# Patient Record
Sex: Male | Born: 1975
Health system: Southern US, Community
[De-identification: ages and names within clinical notes are randomized; demographics above are authoritative.]

## PROBLEM LIST (undated history)

## (undated) DIAGNOSIS — F419 Anxiety disorder, unspecified: Secondary | ICD-10-CM

## (undated) DIAGNOSIS — J449 Chronic obstructive pulmonary disease, unspecified: Secondary | ICD-10-CM

## (undated) DIAGNOSIS — M1990 Unspecified osteoarthritis, unspecified site: Secondary | ICD-10-CM

## (undated) HISTORY — DX: Chronic obstructive pulmonary disease, unspecified: J44.9

## (undated) HISTORY — DX: Unspecified osteoarthritis, unspecified site: M19.90

## (undated) HISTORY — DX: Anxiety disorder, unspecified: F41.9

---

## 2005-01-21 ENCOUNTER — Emergency Department: Payer: Self-pay | Admitting: Emergency Medicine

## 2005-10-18 ENCOUNTER — Emergency Department: Payer: Self-pay | Admitting: Emergency Medicine

## 2009-03-22 ENCOUNTER — Emergency Department: Payer: Self-pay | Admitting: Emergency Medicine

## 2010-01-08 ENCOUNTER — Ambulatory Visit: Payer: Self-pay | Admitting: Family Medicine

## 2019-02-10 ENCOUNTER — Emergency Department: Payer: 59

## 2019-02-10 ENCOUNTER — Emergency Department
Admission: EM | Admit: 2019-02-10 | Discharge: 2019-02-10 | Disposition: A | Discharge status: Home / Self Care | Payer: 59 | Attending: Emergency Medicine | Admitting: Emergency Medicine

## 2019-02-10 ENCOUNTER — Encounter: Payer: Self-pay | Admitting: Emergency Medicine

## 2019-02-10 ENCOUNTER — Telehealth: Payer: Self-pay | Admitting: Internal Medicine

## 2019-02-10 ENCOUNTER — Other Ambulatory Visit: Payer: Self-pay

## 2019-02-10 DIAGNOSIS — F17210 Nicotine dependence, cigarettes, uncomplicated: Secondary | ICD-10-CM | POA: Insufficient documentation

## 2019-02-10 DIAGNOSIS — R079 Chest pain, unspecified: Secondary | ICD-10-CM | POA: Diagnosis not present

## 2019-02-10 LAB — CBC
HCT: 49 % (ref 39.0–52.0)
Hemoglobin: 16.8 g/dL (ref 13.0–17.0)
MCH: 31.2 pg (ref 26.0–34.0)
MCHC: 34.3 g/dL (ref 30.0–36.0)
MCV: 91.1 fL (ref 80.0–100.0)
Platelets: 174 10*3/uL (ref 150–400)
RBC: 5.38 MIL/uL (ref 4.22–5.81)
RDW: 11.9 % (ref 11.5–15.5)
WBC: 8.9 10*3/uL (ref 4.0–10.5)
nRBC: 0 % (ref 0.0–0.2)

## 2019-02-10 LAB — BASIC METABOLIC PANEL
Anion gap: 6 (ref 5–15)
BUN: 8 mg/dL (ref 6–20)
CO2: 27 mmol/L (ref 22–32)
Calcium: 8.9 mg/dL (ref 8.9–10.3)
Chloride: 108 mmol/L (ref 98–111)
Creatinine, Ser: 0.61 mg/dL (ref 0.61–1.24)
GFR calc Af Amer: >60 mL/min (ref >60)
GFR calc non Af Amer: >60 mL/min (ref >60)
Glucose, Bld: 87 mg/dL (ref 70–99)
Potassium: 3.9 mmol/L (ref 3.5–5.1)
Sodium: 141 mmol/L (ref 135–145)

## 2019-02-10 LAB — TROPONIN I (HIGH SENSITIVITY)
Troponin I (High Sensitivity): <2 ng/L (ref <18)
Troponin I (High Sensitivity): <2 ng/L (ref <18)

## 2019-02-10 NOTE — ED Provider Notes (Signed)
[facility] Emergency Department Provider Note  Time seen: 9:47 AM  I have reviewed the triage vital signs and the nursing notes.   HISTORY  Chief Complaint Chest Pain   HPI Lucas Peterson is a 43 y.o. male with no significant past medical history presents to the emergency department for intermittent chest pain.  According to the patient since March she has been experiencing intermittent chest pain which he describes as a throbbing type pain in the center to left chest.  States it is intermittent will come and go, not necessarily associated with exertion.  Patient states last night the pain was somewhat worse, states his father recently passed away from an MI, patient was concerned so he came to the emergency department today for evaluation.  States very mild discomfort currently which he states has been chronic since March.  No worsening with movement or palpation.  No shortness of breath.  Does state mild cough but states this is chronic due to smoking.  Denies any fever.   History reviewed. No pertinent past medical history.  There are no active problems to display for this patient.   History reviewed. No pertinent surgical history.  Prior to Admission medications   Not on File    No Known Allergies  No family history on file.  Social History Social History   Tobacco Use  . Smoking status: Current Every Day Smoker    Packs/day: 1.50    Types: Cigarettes  . Smokeless tobacco: Never Used  Substance Use Topics  . Alcohol use: Never    Frequency: Never  . Drug use: Never    Review of Systems Constitutional: Negative for fever. Cardiovascular: Positive for chest pain Respiratory: Negative for shortness of breath.  Mild cough, chronic. Gastrointestinal: Negative for abdominal pain Musculoskeletal: Negative for leg pain or swelling. Skin: Negative for skin complaints  Neurological: Negative for headache All other ROS  negative  ____________________________________________   PHYSICAL EXAM:  VITAL SIGNS: ED Triage Vitals [02/10/19 0932]  Enc Vitals Group     BP (!) 156/101     Pulse Rate 91     Resp 16     Temp 98.1 F (36.7 C)     Temp Source Oral     SpO2 98 %     Weight 179 lb (81.2 kg)     Height 5\' 10"  (1.778 m)     Head Circumference      Peak Flow      Pain Score 7     Pain Loc      Pain Edu?      Excl. in GC?    Constitutional: Alert and oriented. Well appearing and in no distress. Eyes: Normal exam ENT      Head: Normocephalic and atraumatic.      Mouth/Throat: Mucous membranes are moist. Cardiovascular: Normal rate, regular rhythm. Respiratory: Normal respiratory effort without tachypnea nor retractions.  Light expiratory wheeze. Gastrointestinal: Soft and nontender. No distention.   Musculoskeletal: Nontender with normal range of motion in all extremities. Neurologic:  Normal speech and language. No gross focal neurologic deficits Skin:  Skin is warm, dry and intact.  Psychiatric: Mood and affect are normal.   ____________________________________________    EKG  EKG viewed and interpreted by myself shows a normal sinus rhythm 85 bpm with a narrow QRS, normal axis, normal intervals, no concerning ST changes.  ____________________________________________    RADIOLOGY  Chest x-ray is negative  ____________________________________________   INITIAL IMPRESSION / ASSESSMENT  AND PLAN / ED COURSE  Pertinent labs & imaging results that were available during my care of the patient were reviewed by me and considered in my medical decision making (see chart for details).   Patient presents to the emergency department for chest pain intermittent over the past 3 to 4 months, worse last night.  Differential would include ACS, chest wall pain, pleurisy.  We will check labs including cardiac enzymes.  EKG is reassuring.  Chest x-ray pending.  Patient agreeable to plan of  care.  Patient's work-up is largely negative/reassuring.  Chest x-ray is normal.  Troponin is negative.  We will have the patient follow-up with cardiology as an outpatient.  Patient agreeable plan of care.  Lucas Peterson was evaluated in Emergency Department on 02/10/2019 for the symptoms described in the history of present illness. He was evaluated in the context of the global COVID-19 pandemic, which necessitated consideration that the patient might be at risk for infection with the SARS-CoV-2 virus that causes COVID-19. Institutional protocols and algorithms that pertain to the evaluation of patients at risk for COVID-19 are in a state of rapid change based on information released by regulatory bodies including the CDC and federal and state organizations. These policies and algorithms were followed during the patient's care in the ED.  ____________________________________________   FINAL CLINICAL IMPRESSION(S) / ED DIAGNOSES  Chest pain   Harvest Dark, MD 02/10/19 1217

## 2019-02-10 NOTE — ED Notes (Signed)
Patient discharged to home. IV hep lock d/c'd

## 2019-02-10 NOTE — ED Notes (Signed)
Troponin drawn and sent.

## 2019-02-10 NOTE — ED Triage Notes (Signed)
Patient reports intermittent CP since March. States in March, he had a severe cough that lasted several weeks. Reports since then he has "episodes" of CP. During episodes he reports diaphoresis, dizziness and SOB. Patient reports he recently lost father to a MI.

## 2019-02-10 NOTE — Telephone Encounter (Signed)
° ° °  COVID-19 Pre-Screening Questions:   In the past 7 to 10 days have you had a cough,  shortness of breath, headache, congestion, fever (100 or greater) body aches, chills, sore throat, or sudden loss of taste or sense of smell? No    Have you been around anyone with known Covid 19. no  Have you been around anyone who is awaiting Covid 19 test results in the past 7 to 10 days? no  Have you been around anyone who has been exposed to Covid 19, or has mentioned symptoms of Covid 19 within the past 7 to 10 days? no  If you have any concerns/questions about symptoms patients report during screening (either on the phone or at threshold). Contact the provider seeing the patient or DOD for further guidance.  If neither are available contact a member of the leadership team.  I called pt to confirm appt and pre-screen pt for his 02-13-19 appt with Dr Debara Pickett.

## 2019-02-13 ENCOUNTER — Ambulatory Visit: Payer: 59 | Admitting: Internal Medicine

## 2019-02-28 ENCOUNTER — Other Ambulatory Visit: Payer: Self-pay

## 2019-02-28 ENCOUNTER — Encounter: Payer: Self-pay | Admitting: Family Medicine

## 2019-02-28 ENCOUNTER — Ambulatory Visit (INDEPENDENT_AMBULATORY_CARE_PROVIDER_SITE_OTHER): Payer: Self-pay | Admitting: Family Medicine

## 2019-02-28 VITALS — BP 122/81 | HR 86 | Temp 98.7°F | Resp 16 | Ht 70.0 in | Wt 180.6 lb

## 2019-02-28 DIAGNOSIS — J432 Centrilobular emphysema: Secondary | ICD-10-CM

## 2019-02-28 DIAGNOSIS — R0609 Other forms of dyspnea: Secondary | ICD-10-CM

## 2019-02-28 DIAGNOSIS — Z720 Tobacco use: Secondary | ICD-10-CM

## 2019-02-28 DIAGNOSIS — F411 Generalized anxiety disorder: Secondary | ICD-10-CM

## 2019-02-28 DIAGNOSIS — R7309 Other abnormal glucose: Secondary | ICD-10-CM

## 2019-02-28 MED ORDER — HYDROXYZINE HCL 25 MG PO TABS: 12.5000 mg | ORAL_TABLET | Freq: Two times a day (BID) | ORAL | 1 refills | Status: DC | PRN

## 2019-02-28 MED ORDER — ALBUTEROL SULFATE HFA 108 (90 BASE) MCG/ACT IN AERS: 2.0000 | INHALATION_SPRAY | RESPIRATORY_TRACT | 1 refills | Status: DC | PRN

## 2019-02-28 MED ORDER — ANORO ELLIPTA 62.5-25 MCG/INH IN AEPB: 1.0000 | INHALATION_SPRAY | Freq: Every day | RESPIRATORY_TRACT | 0 refills | Status: DC

## 2019-02-28 NOTE — Progress Notes (Signed)
Subjective:    Patient ID: Lucas Peterson, male    DOB: 04/22/1976, 43 y.o.   MRN: [id number]  Lucas Peterson is a 43 y.o. male presenting on 02/28/2019 for Establish Care (as per patient may be having anxiety or panic attack since had some chest pain went to ED in 06/20 sweating, SOB, insomnia, stress)  [facility]. Referred from Emergency Dept visit on 02/10/19.  HPI   ED FOLLOW-UP VISIT  Hospital/Location: [facility] Date of ED Visit: 02/10/19  Reason for Presenting to ED: Chest Pain Primary (+Secondary) Diagnosis: Chest Pain unspecified  FOLLOW-UP  - ED provider note and record have been reviewed - Patient presents today about 18 days after recent ED visit. Brief summary of recent course, patient had symptoms of intermittent left sided chest wall pain for 3-4 months and dyspnea, presented to ED on 6/26, testing in ED with labs, troponin, chest x-ray, EKG, all unremarkable, treated with discharge reassurance and follow-up with outpatient Cardiology  - Today reports overall has done fairly well after discharge from ED without worsening but still has same symptoms in general.  He reviews past illness onset in December 2019 with frequent coughing and illness, he thought maybe he fractured rib at some point from coughing, then he had later illness in March 2020, was negative for Flu at that time. - He describes episodic pains in Left side of chest wall and affecting his left arm, worse if laying down on left side at night., over past 3-4 months - He says difficulty getting comfortable at night due to pain, he gets too tired eventually falls asleep - He works outside as a Soil scientistland surveyor, works in [employer] - Admits some dyspnea at times with some exertion, questioning if this is related to his smoking and lungs, or other issue, he thinks may also be anxiety related, see below Father recently passed from MI He is not aware of any known exposure to COVID19 but  he does have that report of illness in March 2020 unclear diagnosis  [facility] [place] 03/24/19 at 9am with Dr Jacques NavyAcharya as new patient evaluation to discuss his symptoms and recent work-up  Chronic Anxiety, generalized He admits concerns chronic history of some general anxiety at times. Says he has had anxiety issues for many years, not just recently during coronavirus pandemic or recently with current dyspnea and chest symptoms. He thinks he is an anxious person, never clinically diagnosed or treated before, he does not like medication.  - New medications on discharge: None - Changes to current meds on discharge: None  Additional updates:  History of Low Testosterone Previous PCP tested labs including testosterone was told it was low, given testosterone injections then unsure if this helped.  Pre-Diabetes Reports history of elevated A1c in past, no result available. Meds: None Denies hypoglycemia, polyuria, visual changes, numbness or tingling.   Health Maintenance: [facility]  Dep[facility] screen [facility] 02/28/2019  Decreased Interest 2  Down, Depressed, Hopeless 1  PHQ - 2 Score 3  Altered sleeping 3  Tired, decreased energy 2  Change in appetite 0  Feeling bad or failure about yourself  0  Trouble concentrating 2  Moving slowly or fidgety/restless 0  Suicidal thoughts 0  PHQ-9 Score 10  Difficult doing work/chores Somewhat difficult   GAD 7 : Generalized Anxiety Score 02/28/2019  Nervous, Anxious, on Edge 3  Control/stop worrying 2  Worry too much - different things 2  Trouble relaxing 3  Restless 2  Easily annoyed or irritable 3  Afraid - awful might happen 1  Total GAD 7 Score 16  Anxiety Difficulty Somewhat difficult    Past Medical History:  Diagnosis Date  . Anxiety    History reviewed. No pertinent surgical history. Social History   Socioeconomic History  . Marital status: Single    Spouse name: Not on file   . Number of children: Not on file  . Years of education: Not on file  . Highest education level: Not on file  Occupational History  . Not on file  Social Needs  . Financial resource strain: Not on file  . Food insecurity    Worry: Not on file    Inability: Not on file  . Transportation needs    Medical: Not on file    Non-medical: Not on file  Tobacco Use  . Smoking status: Current Every Day Smoker    Packs/day: 1.50    Years: 20.00    Pack years: 30.00    Types: Cigarettes  . Smokeless tobacco: Never Used  . Tobacco comment: previously 0.5 to 1ppd, now 10 years 1.5 year  Substance and Sexual Activity  . Alcohol use: Never    Frequency: Never  . Drug use: Never  . Sexual activity: Not on file  Lifestyle  . Physical activity    Days per week: Not on file    Minutes per session: Not on file  . Stress: Not on file  Relationships  . Social Herbalist on phone: Not on file    Gets together: Not on file    Attends religious service: Not on file    Active member of club or organization: Not on file    Attends meetings of clubs or organizations: Not on file    Relationship status: Not on file  . Intimate partner violence    Fear of current or ex partner: Not on file    Emotionally abused: Not on file    Physically abused: Not on file    Forced sexual activity: Not on file  Other Topics Concern  . Not on file  Social History Narrative  . Not on file   Family History  Problem Relation Age of Onset  . Stroke Mother 24  . Diabetes Mother   . Stroke Father 33  . Thyroid disease Sister   . Diabetes Brother    Current Outpatient Medications on File Prior to Visit  Medication Sig  . Aspirin-Salicylamide-Caffeine (BC FAST PAIN RELIEF) 650-195-33.3 MG PACK Take 1 packet by mouth as needed.    No current facility-administered medications on file prior to visit.     Review of Systems Per HPI unless specifically indicated above     Objective:    BP 122/81    Pulse 86   Temp 98.7 F (37.1 C) (Oral)   Resp 16   Ht 5\' 10"  (1.778 m)   Wt 180 lb 9.6 oz (81.9 kg)   SpO2 98%   BMI 25.91 kg/m   Wt Readings from Last 3 Encounters:  02/28/19 180 lb 9.6 oz (81.9 kg)  02/10/19 179 lb (81.2 kg)    Physical Exam Vitals signs and nursing note reviewed.  Constitutional:      General: He is not in acute distress.    Appearance: He is well-developed. He is not diaphoretic.     Comments: Well-appearing, comfortable, cooperative  HENT:     Head: Normocephalic and  atraumatic.  Eyes:     General:        Right eye: No discharge.        Left eye: No discharge.     Conjunctiva/sclera: Conjunctivae normal.  Neck:     Musculoskeletal: Normal range of motion and neck supple.     Thyroid: No thyromegaly.  Cardiovascular:     Rate and Rhythm: Normal rate and regular rhythm.     Heart sounds: Normal heart sounds. No murmur.  Pulmonary:     Effort: Pulmonary effort is normal. No respiratory distress.     Breath sounds: Normal breath sounds. No wheezing or rales.  Musculoskeletal: Normal range of motion.  Lymphadenopathy:     Cervical: No cervical adenopathy.  Skin:    General: Skin is warm and dry.     Findings: No erythema or rash.  Neurological:     Mental Status: He is alert and oriented to person, place, and time.  Psychiatric:        Behavior: Behavior normal.     Comments: Well groomed, good eye contact, normal speech and thoughts      I have personally reviewed the radiology report from 02/10/19 CXR.  DG Chest Port 1 ViewPerformed 02/10/2019 Final result  Study Result CLINICAL DATA: Chest pain  EXAM: PORTABLE CHEST 1 VIEW  COMPARISON: October 18, 2005  FINDINGS: There is no evident edema or consolidation. Heart size and pulmonary vascularity are normal. No adenopathy. No pneumothorax. No bone lesions.  IMPRESSION: No edema or consolidation.   Electronically Signed By: Bretta BangWilliam Woodruff III M.D. On: 02/10/2019 10:05   Results  for orders placed or performed during the hospital encounter of 02/10/19  Basic metabolic panel  Result Value Ref Range   Sodium 141 135 - 145 mmol/L   Potassium 3.9 3.5 - 5.1 mmol/L   Chloride 108 98 - 111 mmol/L   CO2 27 22 - 32 mmol/L   Glucose, Bld 87 70 - 99 mg/dL   BUN 8 6 - 20 mg/dL   Creatinine, Ser 1.610.61 0.61 - 1.24 mg/dL   Calcium 8.9 8.9 - 09.610.3 mg/dL   GFR calc non Af Amer >60 >60 mL/min   GFR calc Af Amer >60 >60 mL/min   Anion gap 6 5 - 15  CBC  Result Value Ref Range   WBC 8.9 4.0 - 10.5 K/uL   RBC 5.38 4.22 - 5.81 MIL/uL   Hemoglobin 16.8 13.0 - 17.0 g/dL   HCT 04.549.0 40.939.0 - 81.152.0 %   MCV 91.1 80.0 - 100.0 fL   MCH 31.2 26.0 - 34.0 pg   MCHC 34.3 30.0 - 36.0 g/dL   RDW 91.411.9 78.211.5 - 95.615.5 %   Platelets 174 150 - 400 K/uL   nRBC 0.0 0.0 - 0.2 %  Troponin I (High Sensitivity)  Result Value Ref Range   Troponin I (High Sensitivity) <2 <18 ng/L  Troponin I (High Sensitivity)  Result Value Ref Range   Troponin I (High Sensitivity) <2 <18 ng/L      Assessment & Plan:   Problem List Items Addressed This Visit    Centrilobular emphysema (HCC) - Primary   Relevant Medications   albuterol (VENTOLIN HFA) 108 (90 Base) MCG/ACT inhaler   umeclidinium-vilanterol (ANORO ELLIPTA) 62.5-25 MCG/INH AEPB   DOE (dyspnea on exertion)   Elevated hemoglobin A1c   GAD (generalized anxiety disorder)   Relevant Medications   hydrOXYzine (ATARAX/VISTARIL) 25 MG tablet   Tobacco abuse      Clinically constellation  of symptoms with dyspnea on exertion, intermittent chest pain symptoms over >3 months, additionally with associated anxiety and panic question if this is chronic issue flared up or if onset due to current clinical symptoms - Active heavy smoker for 20 years, with previously suspected COPD but no confirmation or other diagnostic  Plan - Discussed options for further management, reassurance ED evaluation for chest pain cardiac work up was negative or unremarkable - Next step  proceed as already scheduled and referred to Cardiologist, [facility], further cardiac rule out for ASCVD / CAD as possibility for his symptoms, and known fam history - For now, we will focus on breathing symptoms, and encouraged him to cut back and quit smoking eventually - Trial on Albuterol PRN to see if responds to rescue inhaler if COPD - Trial on Anoro sample inhaler 1 puff daily for maintenance therapy for presumed COPD, may order actual rx for patient if improved in 1 week, can repeat sample if need - Additionally with anxiety, advised that will focus on acute more severe panic and insomnia symptoms first, can trial hydroxyzine PRN caution sedation, use only PRN determine if beneficial, he is not preferring to take other long term anxiety medication will defer SSRI such as lexapro for now  May warrant pulm referral in future, other smoking cessation, consider more maintenance anxiety therapy as well.   Meds ordered this encounter  Medications  . albuterol (VENTOLIN HFA) 108 (90 Base) MCG/ACT inhaler    Sig: Inhale 2 puffs into the lungs every 4 (four) hours as needed for wheezing or shortness of breath (cough).    Dispense:  8 g    Refill:  1  . hydrOXYzine (ATARAX/VISTARIL) 25 MG tablet    Sig: Take 0.5-1 tablets (12.5-25 mg total) by mouth 2 (two) times daily as needed for anxiety (sleep).    Dispense:  30 tablet    Refill:  1  . umeclidinium-vilanterol (ANORO ELLIPTA) 62.5-25 MCG/INH AEPB    Sig: Inhale 1 puff into the lungs daily.    Dispense:  7 each    Refill:  0    Follow up plan: Return in about 4 weeks (around 03/28/2019) for Dyspnea, f/u cardiology, inhalers/anxiety.  Lucas PilarAlexander Ezana Hubbert, DO [facility] 02/28/2019, 2:26 PM

## 2019-02-28 NOTE — Patient Instructions (Addendum)
Thank you for coming to the office today.  Unsure if these symptoms are from heart or lungs, or other cause such as anxiety etc.  First follow-up with [facility] in [place] on August 7th at [place], they will discuss other testing  Your tests were normal at Emergency Dept  Next we need to figure out if lungs are problem with feeling winded and your symptoms, can try an Albuterol Rescue inhaler, can ask pharmacist for demo on how to use this.  Also start the sample inhaler given today, follow instructions should be every day. If it is helpful you think for your breathing on a daily basis, then let me know in a week and we can order your own inhaler.  Goal to cutback and quit smoking eventually, when you are ready, we can help more.  QUITLINE 1 800-QUIT NOW  Also for anxiety we can try to calm down acute panic episode symptoms - can try Hydroxyzine half or whole pill as needed 1-2 times a day, caution sedation, similar to a benadryl, this is for sleep, insomnia, anxiety panic attack.  Let me know what is or isnt working and we can follow-up soon and may need to discuss a Lung Doctor referral further testing if needed.  Also consider future Lexapro (anxiety medicine, this is a longer term med, very safe and does great for anxiety but it is a slower medicine, not likely goal for today)  Consider a COVID19 Antibody test, blood draw to see if you had been exposed before. Let us know if you want this test.  Please schedule a Follow-up Appointment to: Return in about 4 weeks (around 03/28/2019) for Dyspnea, f/u cardiology, inhalers/anxiety.  If you have any other questions or concerns, please feel free to call the office or send a message through [place]. You may also schedule an earlier appointment if necessary.  Additionally, you may be receiving a survey about your experience at our office within a few days to 1 week by e-mail or mail. We value your feedback.  Nobie Putnam,  DO [place]

## 2019-03-08 ENCOUNTER — Telehealth: Payer: Self-pay

## 2019-03-08 DIAGNOSIS — J432 Centrilobular emphysema: Secondary | ICD-10-CM

## 2019-03-08 MED ORDER — ANORO ELLIPTA 62.5-25 MCG/INH IN AEPB: 1.0000 | INHALATION_SPRAY | Freq: Every day | RESPIRATORY_TRACT | 2 refills | Status: DC

## 2019-03-08 NOTE — Telephone Encounter (Signed)
Called patient about moving appointment a day ahead and for him to call the office to let me know if he was okay with that.

## 2019-03-08 NOTE — Telephone Encounter (Signed)
Spoke with patient he agreed to switch his appointment to a day before his original appointment.

## 2019-03-08 NOTE — Telephone Encounter (Signed)
Patient called and reported that the anoro is working for him.  Please send rx to [facility].

## 2019-03-08 NOTE — Telephone Encounter (Signed)
Excellent update. I have sent new rx Anoro inhaler 1 puff daily to [facility]. Follow-up as planned.  Nobie Putnam, [facility] 03/08/2019, 2:15 PM

## 2019-03-15 ENCOUNTER — Telehealth: Payer: Self-pay | Admitting: *Deleted

## 2019-03-15 NOTE — Telephone Encounter (Signed)
LVMTCB so we can change office visit to virtual.

## 2019-03-23 ENCOUNTER — Other Ambulatory Visit: Payer: Self-pay

## 2019-03-23 ENCOUNTER — Encounter: Payer: Self-pay | Admitting: Internal Medicine

## 2019-03-23 ENCOUNTER — Telehealth: Payer: Self-pay

## 2019-03-23 ENCOUNTER — Telehealth (INDEPENDENT_AMBULATORY_CARE_PROVIDER_SITE_OTHER): Payer: Self-pay | Admitting: Internal Medicine

## 2019-03-23 ENCOUNTER — Ambulatory Visit: Payer: 59 | Admitting: Cardiovascular Disease

## 2019-03-23 NOTE — Progress Notes (Signed)
Patient did not return call for scheduled appt today after multiple attempts to connect. Please close encounter.

## 2019-03-23 NOTE — Telephone Encounter (Signed)
Left message for patient to call office back to finish telephone call with Dr. Margarito Liner. Dr. Margarito Liner would like to complete to call after 4:00 today

## 2019-03-24 ENCOUNTER — Ambulatory Visit: Payer: 59 | Admitting: Internal Medicine

## 2019-04-10 NOTE — Progress Notes (Deleted)
Cardiology Office Note:   Date:  04/10/2019  NAME:  Lucas Peterson    MRN: [id number] DOB:  1976-02-24   PCP:  Patient, No Pcp Per  Cardiologist:  Lucas Field, MD  Electrophysiologist:  None   Referring MD: No ref. provider found   No chief complaint on file. ***  History of Present Illness:   Lucas Peterson is a 43 y.o. male without significant medical history who is being seen today for the evaluation of chest pain at the request of Lucas Dark, MD.  Past Medical History: Past Medical History:  Diagnosis Date  . Anxiety     Past Surgical History: No past surgical history on file.  Current Medications: No outpatient medications have been marked as taking for the 04/11/19 encounter (Appointment) with O'Neal, Cassie Freer, MD.     Allergies:    Patient has no known allergies.   Social History: Social History   Socioeconomic History  . Marital status: Single    Spouse name: Not on file  . Number of children: Not on file  . Years of education: [employer]  . Highest education level: High school graduate  Occupational History  . Not on file  Social Needs  . Financial resource strain: Not on file  . Food insecurity    Worry: Not on file    Inability: Not on file  . Transportation needs    Medical: Not on file    Non-medical: Not on file  Tobacco Use  . Smoking status: Current Every Day Smoker    Packs/day: 1.50    Years: 20.00    Pack years: 30.00    Types: Cigarettes  . Smokeless tobacco: Never Used  . Tobacco comment: previously 0.5 to 1ppd, now 10 years 1.5 year  Substance and Sexual Activity  . Alcohol use: Never    Frequency: Never  . Drug use: Never  . Sexual activity: Not on file  Lifestyle  . Physical activity    Days per week: Not on file    Minutes per session: Not on file  . Stress: Not on file  Relationships  . Social Herbalist on phone: Not on file    Gets together: Not on file    Attends religious service:  Not on file    Active member of club or organization: Not on file    Attends meetings of clubs or organizations: Not on file    Relationship status: Not on file  Other Topics Concern  . Not on file  Social History Narrative  . Not on file     Family History: The patient's ***family history includes Diabetes in his brother and mother; Stroke (age of onset: 13) in his mother; Stroke (age of onset: 45) in his father; Thyroid disease in his sister.  ROS:   All other ROS reviewed and negative. Pertinent positives noted in the HPI.     EKGs/Labs/Other Studies Reviewed:   The following studies were personally reviewed by me today:  EKG:  EKG is *** ordered today.  The ekg ordered today demonstrates ***, and was personally reviewed by me.   Recent Labs: 02/10/2019: BUN 8; Creatinine, Ser 0.61; Hemoglobin 16.8; Platelets 174; Potassium 3.9; Sodium 141   Recent Lipid Panel No results found for: CHOL, TRIG, HDL, CHOLHDL, VLDL, LDLCALC, LDLDIRECT  Physical Exam:   VS:  There were no vitals taken for this visit.   Wt Readings from Last 3 Encounters:  03/23/19 180 lb (  81.6 kg)  02/28/19 180 lb 9.6 oz (81.9 kg)  02/10/19 179 lb (81.2 kg)    General: Well nourished, well developed, in no acute distress Heart: Atraumatic, normal size  Eyes: PEERLA, EOMI  Neck: Supple, no JVD Endocrine: No thryomegaly Cardiac: Normal S1, S2; RRR; no murmurs, rubs, or gallops Lungs: Clear to auscultation bilaterally, no wheezing, rhonchi or rales  Abd: Soft, nontender, no hepatomegaly  Ext: No edema, pulses 2+ Musculoskeletal: No deformities, BUE and BLE strength normal and equal Skin: Warm and dry, no rashes   Neuro: Alert and oriented to person, place, time, and situation, CNII-XII grossly intact, no focal deficits  Psych: Normal mood and affect   ASSESSMENT:   NAME@ is a 43 y.o. male who presents for the following: No diagnosis found.  PLAN:   There are no diagnoses linked to this encounter.   Disposition: No follow-ups on file.  Medication Adjustments/Labs and Tests Ordered: Current medicines are reviewed at length with the patient today.  Concerns regarding medicines are outlined above.  No orders of the defined types were placed in this encounter.  No orders of the defined types were placed in this encounter.   There are no Patient Instructions on file for this visit.   Signed, Lenna GilfordWesley T. Flora Lipps'Neal, MD [facility]  [facility]  [address], [address] [place], [place] [address] [phone]  04/10/2019 4:01 PM

## 2019-04-11 ENCOUNTER — Ambulatory Visit: Payer: 59 | Admitting: Cardiovascular Disease

## 2019-04-25 ENCOUNTER — Other Ambulatory Visit: Payer: Self-pay

## 2019-04-25 DIAGNOSIS — Z20822 Contact with and (suspected) exposure to covid-19: Secondary | ICD-10-CM

## 2019-04-26 LAB — NOVEL CORONAVIRUS, NAA: SARS-CoV-2, NAA: NOT DETECTED

## 2019-05-09 ENCOUNTER — Other Ambulatory Visit: Payer: Self-pay | Admitting: Family Medicine

## 2019-05-09 DIAGNOSIS — F411 Generalized anxiety disorder: Secondary | ICD-10-CM

## 2019-05-13 ENCOUNTER — Other Ambulatory Visit: Payer: Self-pay | Admitting: Family Medicine

## 2019-05-13 DIAGNOSIS — J432 Centrilobular emphysema: Secondary | ICD-10-CM

## 2019-05-19 ENCOUNTER — Emergency Department: Payer: 59

## 2019-05-19 ENCOUNTER — Other Ambulatory Visit: Payer: Self-pay

## 2019-05-19 ENCOUNTER — Emergency Department
Admission: EM | Admit: 2019-05-19 | Discharge: 2019-05-19 | Disposition: A | Discharge status: Home / Self Care | Payer: 59 | Attending: Emergency Medicine | Admitting: Emergency Medicine

## 2019-05-19 DIAGNOSIS — F17210 Nicotine dependence, cigarettes, uncomplicated: Secondary | ICD-10-CM | POA: Insufficient documentation

## 2019-05-19 DIAGNOSIS — R0789 Other chest pain: Secondary | ICD-10-CM | POA: Insufficient documentation

## 2019-05-19 LAB — CBC
HCT: 48.5 % (ref 39.0–52.0)
Hemoglobin: 17.1 g/dL — ABNORMAL HIGH (ref 13.0–17.0)
MCH: 30.9 pg (ref 26.0–34.0)
MCHC: 35.3 g/dL (ref 30.0–36.0)
MCV: 87.5 fL (ref 80.0–100.0)
Platelets: 198 10*3/uL (ref 150–400)
RBC: 5.54 MIL/uL (ref 4.22–5.81)
RDW: 12.2 % (ref 11.5–15.5)
WBC: 14.1 10*3/uL — ABNORMAL HIGH (ref 4.0–10.5)
nRBC: 0 % (ref 0.0–0.2)

## 2019-05-19 LAB — BASIC METABOLIC PANEL
Anion gap: 9 (ref 5–15)
BUN: 19 mg/dL (ref 6–20)
CO2: 24 mmol/L (ref 22–32)
Calcium: 8.9 mg/dL (ref 8.9–10.3)
Chloride: 107 mmol/L (ref 98–111)
Creatinine, Ser: 0.91 mg/dL (ref 0.61–1.24)
GFR calc Af Amer: >60 mL/min (ref >60)
GFR calc non Af Amer: >60 mL/min (ref >60)
Glucose, Bld: 131 mg/dL — ABNORMAL HIGH (ref 70–99)
Potassium: 3.4 mmol/L — ABNORMAL LOW (ref 3.5–5.1)
Sodium: 140 mmol/L (ref 135–145)

## 2019-05-19 LAB — TROPONIN I (HIGH SENSITIVITY)
Troponin I (High Sensitivity): 5 ng/L (ref <18)
Troponin I (High Sensitivity): 6 ng/L (ref <18)

## 2019-05-19 MED ORDER — KETOROLAC TROMETHAMINE 30 MG/ML IJ SOLN
15.0000 mg | Freq: Once | INTRAMUSCULAR | Status: AC
  Administered 2019-05-19: 15 mg via INTRAVENOUS
  Filled 2019-05-19: qty 1

## 2019-05-19 MED ORDER — ACETAMINOPHEN 500 MG PO TABS
1000.0000 mg | ORAL_TABLET | Freq: Once | ORAL | Status: AC
  Administered 2019-05-19: 1000 mg via ORAL
  Filled 2019-05-19: qty 2

## 2019-05-19 NOTE — ED Notes (Signed)
Pt gathered all personal belongings from room and removed them prior to ED departure  

## 2019-05-19 NOTE — ED Triage Notes (Addendum)
Patient c/o left chest pain radiating to left shoulder beginning on June 26th. Patient has been seen in this ED and orthopedics for same and dx with a left shoulder impingment.   Patient reports this morning he had an episode of diaphoresis, SOB, and near syncope.

## 2019-05-19 NOTE — ED Notes (Signed)
Pt st "the pains went away to be honest. It's not hurting nearly as bad as it was when I came in". No obvious deformities/redness/swelling noted to the LT arm. Pt st pain is intermittent and radiates down LT FA and upper left back into chest region'. See [facility]

## 2019-05-19 NOTE — Discharge Instructions (Signed)
Your work-up was reassuring including negative cardiac markers and chest x-ray.  Your white count was slightly elevated but this can be nonspecific.  You should return to the ER if you develop fevers, cough, worsening chest pain or any other concerns.  Otherwise you should follow-up with cardiology.

## 2019-05-19 NOTE — ED Provider Notes (Signed)
[facility] Emergency Department Provider Note  ____________________________________________   First MD Initiated Contact with Patient 05/19/19 [phone]     (approximate)  I have reviewed the triage vital signs and the nursing notes.   HISTORY  Chief Complaint Chest Pain    HPI Lucas Peterson is a 43 y.o. male who presented with chest pain radiating to his left shoulder that started on June 26.  Patient had intermittent chest pain for the past few months.  He was seen in the ER and had a negative work-up and was unable to follow-up with cardiology due to rescheduling of the appointment multiple times.  Patient now presents with pain that started this morning at 5 AM that was on the left side of his chest, severe, constant, radiated to left shoulder, better with his anti-inflammatory.  Currently his pain is a 5 out of 10 which is consistent with his normal chest pain.  The chest pain came on he did have a little bit of shortness of breath that is since resolved.  He is currently being worked up for COPD by his primary care doctor.  He denies any leg swelling, recent travel, history of blood clot.  He also felt he was going to pass out but did not actually lose consciousness he just felt weak all over.   Endorses that he was really worried that it was related to his heart given the fact that both of his parents had heart attacks in their late 23s.            Past Medical History:  Diagnosis Date   Anxiety     Patient Active Problem List   Diagnosis Date Noted   Centrilobular emphysema (HCC) 02/28/2019   GAD (generalized anxiety disorder) 02/28/2019   DOE (dyspnea on exertion) 02/28/2019   Tobacco abuse 02/28/2019   Elevated hemoglobin A1c 02/28/2019    History reviewed. No pertinent surgical history.  Prior to Admission medications   Medication Sig Start Date End Date Taking? Authorizing Provider  ANORO ELLIPTA 62.5-25 MCG/INH AEPB Inhale 1  puff into the lungs daily. 03/08/19   Karamalegos, Netta Neat, DO  Aspirin-Salicylamide-Caffeine (BC FAST PAIN RELIEF) [phone] MG PACK Take 1 packet by mouth as needed.     [provider]  hydrOXYzine (ATARAX/VISTARIL) 25 MG tablet TAKE 0.5-1 TABLETS (12.5-25 MG TOTAL) BY MOUTH 2 (TWO) TIMES DAILY AS NEEDED FOR ANXIETY (SLEEP). 05/09/19   Karamalegos, Alexander J, DO  VENTOLIN HFA 108 (90 Base) MCG/ACT inhaler INHALE 2 PUFFS INTO THE LUNGS EVERY 4 (FOUR) HOURS AS NEEDED FOR WHEEZING OR SHORTNESS OF BREATH (COUGH). 05/14/19   Smitty Cords, DO    Allergies Patient has no known allergies.  Family History  Problem Relation Age of Onset   Stroke Mother 48   Diabetes Mother    Stroke Father 39   Thyroid disease Sister    Diabetes Brother     Social History Social History   Tobacco Use   Smoking status: Current Every Day Smoker    Packs/day: 1.50    Years: 20.00    Pack years: 30.00    Types: Cigarettes   Smokeless tobacco: Never Used   Tobacco comment: previously 0.5 to 1ppd, now 10 years 1.5 year  Substance Use Topics   Alcohol use: Never    Frequency: Never   Drug use: Never      Review of Systems Constitutional: No fever/chills Eyes: No visual changes. ENT: No sore throat. Cardiovascular: Positive chest  pain Respiratory: Positive shortness of breath now resolved Gastrointestinal: No abdominal pain.  No nausea, no vomiting.  No diarrhea.  No constipation. Genitourinary: Negative for dysuria. Musculoskeletal: Negative for back pain. Skin: Negative for rash. Neurological: Negative for headaches, focal weakness or numbness.  Positive lightheadedness. All other ROS negative ____________________________________________   PHYSICAL EXAM:  VITAL SIGNS: ED Triage Vitals  Enc Vitals Group     BP 05/19/19 0625 (!) 145/91     Pulse Rate 05/19/19 0625 95     Resp 05/19/19 0625 (!) 23     Temp 05/19/19 0625 97.8 F (36.6 C)     Temp  Source 05/19/19 0625 Oral     SpO2 05/19/19 0625 99 %     Weight 05/19/19 0626 175 lb (79.4 kg)     Height 05/19/19 0626 5\' 10"  (1.778 m)     Head Circumference --      Peak Flow --      Pain Score 05/19/19 0626 8     Pain Loc --      Pain Edu? --      Excl. in GC? --     Constitutional: Alert and oriented. Well appearing and in no acute distress. Eyes: Conjunctivae are normal. EOMI. Head: Atraumatic. Nose: No congestion/rhinnorhea. Mouth/Throat: Mucous membranes are moist.   Neck: No stridor. Trachea Midline. FROM Cardiovascular: Normal rate, regular rhythm. Grossly normal heart sounds.  Good peripheral circulation.  Equal radial pulses.  No rash noted.  Some tenderness underneath the left nipple up into the armpit with palpation. Respiratory: Normal respiratory effort.  No retractions. Lungs CTAB. Gastrointestinal: Soft and nontender. No distention. No abdominal bruits.  Musculoskeletal: No lower extremity tenderness nor edema.  No joint effusions. Neurologic:  Normal speech and language. No gross focal neurologic deficits are appreciated.  Skin:  Skin is warm, dry and intact. No rash noted. Psychiatric: Mood and affect are normal. Speech and behavior are normal. GU: Deferred   ____________________________________________   LABS (all labs ordered are listed, but only abnormal results are displayed)  Labs Reviewed  BASIC METABOLIC PANEL - Abnormal; Notable for the following components:      Result Value   Potassium 3.4 (*)    Glucose, Bld 131 (*)    All other components within normal limits  CBC - Abnormal; Notable for the following components:   WBC 14.1 (*)    Hemoglobin 17.1 (*)    All other components within normal limits  TROPONIN I (HIGH SENSITIVITY)  TROPONIN I (HIGH SENSITIVITY)   ____________________________________________   ED ECG REPORT I, Concha SeMary E Rual Vermeer, the attending physician, personally viewed and interpreted this ECG.  EKG is normal sinus rate of 92,  no ST elevation, no T wave inversion, normal intervals ____________________________________________  RADIOLOGY Vela ProseI, Camryn Lampson E Len Kluver, personally viewed and evaluated these images (plain radiographs) as part of my medical decision making, as well as reviewing the written report by the radiologist.  ED MD interpretation: Chest x-ray without evidence of pneumonia  Official radiology report(s): Dg Chest Port 1 View  Result Date: 05/19/2019 CLINICAL DATA:  Chest pain EXAM: PORTABLE CHEST 1 VIEW COMPARISON:  10/18/2005 FINDINGS: Normal heart size and mediastinal contours. No acute infiltrate or edema. No effusion or pneumothorax. No acute osseous findings. IMPRESSION: Stable, negative chest. Electronically Signed   By: Marnee SpringJonathon  Watts M.D.   On: 05/19/2019 06:55    ____________________________________________   PROCEDURES  Procedure(s) performed (including Critical Care):  Procedures   ____________________________________________   INITIAL IMPRESSION /  ASSESSMENT AND PLAN / ED COURSE   KRISTINA BERTONE was evaluated in Emergency Department on 05/19/2019 for the symptoms described in the history of present illness. He was evaluated in the context of the global COVID-19 pandemic, which necessitated consideration that the patient might be at risk for infection with the SARS-CoV-2 virus that causes COVID-19. Institutional protocols and algorithms that pertain to the evaluation of patients at risk for COVID-19 are in a state of rapid change based on information released by regulatory bodies including the CDC and federal and state organizations. These policies and algorithms were followed during the patient's care in the ED.    Most Likely DDx:  -MSK (atypical chest pain) but will get cardiac markers to evaluate for ACS given risk factors/age.  Patient's lightheadedness most likely secondary to pain but will evaluate for anemia. No actual LOC.    DDx that was also considered d/t potential to cause  harm, but was found less likely based on history and physical (as detailed above): -PNA (no fevers, cough but CXR to evaluate) -PNX (reassured with equal b/l breath sounds, CXR to evaluate) -Symptomatic anemia (will get H&H) -Pulmonary embolism as no sob at rest, not pleuritic in nature, no hypoxia, PERC criteria is negative. -Aortic Dissection as no tearing pain and no radiation to the mid back, pulses equal, chest x-ray no widened mediastinum. -Pericarditis no rub on exam, EKG changes or hx to suggest dx -Tamponade (no notable SOB, tachycardic, hypotensive) -Esophageal rupture (no h/o diffuse vomitting/no crepitus)    White count slightly elevated at 14.1 but he denies any infectious symptoms such as cough, fever, urinary symptoms, abdominal pain to suggest infection.  Hemoglobin is at baseline Glucose slightly elevated 131 but patient was not fasting. Initial troponin was 5.  Given patient's chest pain started just prior to presentation will need repeat troponin.  Patient heart score is less than 3 and repeat troponin was 6.  Per chest pain algorithm and given heart score is less than 3 will discharge patient with cardiology follow-up.  Patient is feeling better after he medications.  Patient feels comfortable with this plan.       ____________________________________________   FINAL CLINICAL IMPRESSION(S) / ED DIAGNOSES   Final diagnoses:  Atypical chest pain     MEDICATIONS GIVEN DURING THIS VISIT:  Medications  ketorolac (TORADOL) 30 MG/ML injection 15 mg (15 mg Intravenous Given 05/19/19 0738)  acetaminophen (TYLENOL) tablet 1,000 mg (1,000 mg Oral Given 05/19/19 1308)     ED Discharge Orders    None       Note:  This document was prepared using Dragon voice recognition software and may include unintentional dictation errors.   Vanessa [place], MD 05/19/19 [phone]

## 2019-05-22 ENCOUNTER — Ambulatory Visit: Payer: 59 | Admitting: Family Medicine

## 2019-06-06 ENCOUNTER — Other Ambulatory Visit: Payer: Self-pay | Admitting: *Deleted

## 2019-06-06 DIAGNOSIS — Z20822 Contact with and (suspected) exposure to covid-19: Secondary | ICD-10-CM

## 2019-06-07 LAB — NOVEL CORONAVIRUS, NAA: SARS-CoV-2, NAA: NOT DETECTED

## 2019-07-06 ENCOUNTER — Other Ambulatory Visit: Payer: Self-pay | Admitting: Family Medicine

## 2019-07-06 DIAGNOSIS — F411 Generalized anxiety disorder: Secondary | ICD-10-CM

## 2019-07-16 ENCOUNTER — Other Ambulatory Visit: Payer: Self-pay | Admitting: Family Medicine

## 2019-07-16 DIAGNOSIS — J432 Centrilobular emphysema: Secondary | ICD-10-CM

## 2019-07-21 ENCOUNTER — Other Ambulatory Visit: Payer: Self-pay

## 2019-07-21 ENCOUNTER — Emergency Department
Admission: EM | Admit: 2019-07-21 | Discharge: 2019-07-21 | Disposition: A | Discharge status: Home / Self Care | Payer: 59 | Attending: Emergency Medicine | Admitting: Emergency Medicine

## 2019-07-21 ENCOUNTER — Emergency Department: Payer: 59

## 2019-07-21 DIAGNOSIS — R079 Chest pain, unspecified: Secondary | ICD-10-CM | POA: Insufficient documentation

## 2019-07-21 DIAGNOSIS — Z5321 Procedure and treatment not carried out due to patient leaving prior to being seen by health care provider: Secondary | ICD-10-CM | POA: Diagnosis not present

## 2019-07-21 LAB — BASIC METABOLIC PANEL
Anion gap: 9 (ref 5–15)
BUN: 11 mg/dL (ref 6–20)
CO2: 26 mmol/L (ref 22–32)
Calcium: 8.9 mg/dL (ref 8.9–10.3)
Chloride: 104 mmol/L (ref 98–111)
Creatinine, Ser: 0.93 mg/dL (ref 0.61–1.24)
GFR calc Af Amer: >60 mL/min (ref >60)
GFR calc non Af Amer: >60 mL/min (ref >60)
Glucose, Bld: 111 mg/dL — ABNORMAL HIGH (ref 70–99)
Potassium: 3.4 mmol/L — ABNORMAL LOW (ref 3.5–5.1)
Sodium: 139 mmol/L (ref 135–145)

## 2019-07-21 LAB — CBC
HCT: 46.9 % (ref 39.0–52.0)
Hemoglobin: 16.3 g/dL (ref 13.0–17.0)
MCH: 30.2 pg (ref 26.0–34.0)
MCHC: 34.8 g/dL (ref 30.0–36.0)
MCV: 87 fL (ref 80.0–100.0)
Platelets: 188 10*3/uL (ref 150–400)
RBC: 5.39 MIL/uL (ref 4.22–5.81)
RDW: 12.4 % (ref 11.5–15.5)
WBC: 11 10*3/uL — ABNORMAL HIGH (ref 4.0–10.5)
nRBC: 0 % (ref 0.0–0.2)

## 2019-07-21 LAB — TROPONIN I (HIGH SENSITIVITY): Troponin I (High Sensitivity): 6 ng/L (ref <18)

## 2019-07-21 NOTE — ED Triage Notes (Signed)
Patient reports chest pain off/on since March.  Reports has been to the ED several times, has seen a cardiologist and currently seeing physical therapy and no one can tell him why he is having chest pain.

## 2019-07-24 ENCOUNTER — Telehealth: Payer: Self-pay | Admitting: Emergency Medicine

## 2019-07-24 NOTE — Telephone Encounter (Addendum)
Called patient due to lwot to inquire about condition and follow up plans.  Someone answered and thencut off  He called me back.  Says he did get better.  He has had this and been to cardiologist with no answer.  Encouraged to  Work  With pcp to get answers and explained that it can be other things than heart.  Also told him that he was correct to come in for chest pain when he was worried he was having a heart attack

## 2019-07-26 ENCOUNTER — Other Ambulatory Visit: Payer: Self-pay

## 2019-07-26 ENCOUNTER — Ambulatory Visit (INDEPENDENT_AMBULATORY_CARE_PROVIDER_SITE_OTHER): Payer: 59 | Admitting: Family Medicine

## 2019-07-26 ENCOUNTER — Encounter: Payer: Self-pay | Admitting: Family Medicine

## 2019-07-26 DIAGNOSIS — M792 Neuralgia and neuritis, unspecified: Secondary | ICD-10-CM | POA: Diagnosis not present

## 2019-07-26 DIAGNOSIS — M25512 Pain in left shoulder: Secondary | ICD-10-CM | POA: Diagnosis not present

## 2019-07-26 DIAGNOSIS — R079 Chest pain, unspecified: Secondary | ICD-10-CM | POA: Diagnosis not present

## 2019-07-26 DIAGNOSIS — R072 Precordial pain: Secondary | ICD-10-CM | POA: Diagnosis not present

## 2019-07-26 DIAGNOSIS — G8929 Other chronic pain: Secondary | ICD-10-CM

## 2019-07-26 MED ORDER — PREDNISONE 20 MG PO TABS: ORAL_TABLET | ORAL | 0 refills | Status: DC

## 2019-07-26 NOTE — Patient Instructions (Addendum)
    Please schedule a Follow-up Appointment to: Return in about 1 week (around 08/02/2019), or if symptoms worsen or fail to improve, for Follow-up as needed for chest pain within 1 week if unresolved, w/ cardiology.  If you have any other questions or concerns, please feel free to call the office or send a message through [facility]. You may also schedule an earlier appointment if necessary.  Additionally, you may be receiving a survey about your experience at our office within a few days to 1 week by e-mail or mail. We value your feedback.  Lucas Putnam, DO [facility]

## 2019-07-26 NOTE — Progress Notes (Signed)
Subjective:    Patient ID: Lucas Peterson, male    DOB: [birth year], 43 y.o.   MRN: [id number]  Lucas Peterson is a 43 y.o. male presenting on 07/26/2019 for Hospitalization Follow-up (go over xray result)  Virtual / Telehealth Encounter - Telephone  The purpose of this virtual visit is to provide medical care while limiting exposure to the novel coronavirus (COVID19) for both patient and office staff.  Consent was obtained for remote visit:  Yes.   Answered questions that patient had about telehealth interaction:  Yes.   I discussed the limitations, risks, security and privacy concerns of performing an evaluation and management service by video/telephone. I also discussed with the patient that there may be a patient responsible charge related to this service. The patient expressed understanding and agreed to proceed.  Patient Location: Home Provider Location: [facility] (Office)   HPI    ED FOLLOW-UP VISIT  Hospital/Location: [facility] Date of ED Visit: 07/21/19  Reason for Presenting to ED: Chest Pain  FOLLOW-UP  - ED provider note and record have been reviewed - Patient presents today about 5 days after recent ED visit. Brief summary of recent course, patient had symptoms of persistent chest pain / L neck shoulder arm pain for >2+ months , testing in ED with labs, EKG, Chest x-ray, results showed troponin negative, labs unremarkable. Did not see ED provider. Left after initial testing.  - Today reports overall has been mostly unchanged after discharge from ED.  Followed by Dr Dorothyann Peng [facility] completed ECHOcardiogram and Lexiscan Myoview unremarkable, but has had recurrent symptoms of L arm pain and chest pain episodes. Also he has done orthopedic evaluation - 5 weeks of physical therapy for L shoulder, to do more x-ray imaging  Frustrated no one can figure out cause. He is having persistent symptoms with chest pain mostly central and some  radiating pain L shoulder and neck, seems throughout the day he has moderate constant pain 4 out of 10, worse at night.  He was given NSAIDs meloxicam these are not helping his chest pain symptoms. But does help other   - New medications on discharge: None - Changes to current meds on discharge: None   I have reviewed the discharge medication list, and have reconciled the current and discharge medications today.    Depression screen PHQ 2/9 02/28/2019  Decreased Interest 2  Down, Depressed, Hopeless 1  PHQ - 2 Score 3  Altered sleeping 3  Tired, decreased energy 2  Change in appetite 0  Feeling bad or failure about yourself  0  Trouble concentrating 2  Moving slowly or fidgety/restless 0  Suicidal thoughts 0  PHQ-9 Score 10  Difficult doing work/chores Somewhat difficult    Social History   Tobacco Use   Smoking status: Current Every Day Smoker    Packs/day: 1.50    Years: 20.00    Pack years: 30.00    Types: Cigarettes   Smokeless tobacco: Current User   Tobacco comment: previously 0.5 to 1ppd, now 10 years 1.5 year  Substance Use Topics   Alcohol use: Never    Frequency: Never   Drug use: Never    Review of Systems Per HPI unless specifically indicated above     Objective:    There were no vitals taken for this visit.  Wt Readings from Last 3 Encounters:  07/21/19 180 lb (81.6 kg)  05/19/19 175 lb (79.4 kg)  03/23/19 180 lb (81.6 kg)  Physical Exam   Virtual telephone visit today. No physical exam performed.    I have personally reviewed the radiology report from ED Chest X-ray 07/21/19  DG Chest 2 ViewPerformed 07/21/2019 Final result  Study Result CLINICAL DATA: Chest pain  EXAM: CHEST - 2 VIEW  COMPARISON: 05/19/2019  FINDINGS: Cardiomediastinal contours are normal.  Lungs are clear.  On the lateral view there is added density behind the sternum of uncertain significance. This could represent a prominent fat pad based on density  but shows lobulated margins.  No acute bone finding.  IMPRESSION: Added density behind the sternum on the lateral view is of uncertain significance. Finding is suggestive of prominent epicardial fat pad. Given history of chest pain chest CT may be helpful for further assessment.  No signs of consolidation or pleural effusion.   Electronically Signed By: Donzetta Kohut M.D. On: 07/21/2019 20:32  --------------------------------  Prior work up from Cardiologist  06/14/19 2020 October NM myocardial perfusion SPECT multiple (stress and rest)06/14/2019 [facility] System Result Impression   Normal myocardial perfusion scan no evidence of stress-induced  myocardial ischemia ejection fraction of 50% conclusion negative scan  Result Narrative  CARDIOLOGY DEPARTMENT [facility] [address] [place], [place] [address] [phone]  Procedure: Exercise Myocardial Perfusion Imaging  ONE day procedure  Indication: Angina at rest (CMS-HCC) Plan: NM myocardial perfusion SPECT multiple (stress     and rest), ECG stress test only  Ordering Physician:   Dr. Dorothyann Peng   Clinical History: 43 y.o. year old male recent anginal symptoms Vitals: Height: 69 in Weight: 178 lb Cardiac risk factors include:   FAMILY HISTORY and Smoking    Procedure: The patient performed treadmill exercise using a Bruce protocol for 10:30  minutes. The exercise test was stopped due to fatigue. Blood pressure  response was normal. The patient did not develop any symptoms other than  fatigue during the procedure  Rest HR: 100bpm Rest BP: 128/71mmHg Max HR: 162/80bpm Max BP: Mets:   12.30 % MAX HR:  90%  Stress Test Administered by: Percell Boston CMA  ECG Interpretation: Rest ECG: normal sinus rhythm, none Stress ECG: sinus tachycardia,  Recovery ECG: normal sinus rhythm ECG Interpretation: negative, nondiagnostic  changes.   Administrations This Visit   technetium Tc5m sestamibi (CARDIOLITE) injection 11.71 millicurie   Admin Date 06/13/2019 Action Given Dose 11.71 millicurie Route Intravenous Administered By Titus Dubin, CNMT     technetium Tc77m sestamibi (CARDIOLITE) injection 31.49 millicurie   Admin Date 06/13/2019 Action Given Dose 31.49 millicurie Route Intravenous Administered By Titus Dubin, CNMT       Gated post-stress perfusion imaging was performed 30 minutes after stress.  Rest images were performed 30 minutes after injection.  Gated LV Analysis:   Summary of LV Perfusion: Normal  Summary of LV Function: Normal  TID: 0.98  LVEF= 50 %  FINDINGS: Regional wall motion: reveals normal myocardial thickening and wall  motion. The overall quality of the study is good.  Artifacts noted: no Left ventricular cavity: normal.  Perfusion Analysis: SPECT images demonstrate homogeneous tracer  distribution throughout the myocardium. Defect type: Normal    Echo complete10/28/2020 [facility] System Component Name Value Ref Range  LV Ejection Fraction (%) 50   Aortic Valve Stenosis Grade none   Aortic Valve Regurgitation Grade none   Aortic Valve Max Velocity (m/s) 1.1 m/sec  Mitral Valve Stenosis Grade none   Mitral Valve Regurgitation Grade trivial  Tricuspid Valve Regurgitation Grade trivial   LV End Diastolic Diameter (cm) 4.5 cm  LV End Systolic Diameter (cm) 3.4 cm  LV Septum Wall Thickness (cm) 1 cm  LV Posterior Wall Thickness (cm) 0.8 cm  Left Atrium Diameter (cm) 3.7 cm  Result Narrative            CARDIOLOGY DEPARTMENT          GEAROLD, WAINER      [facility]                  [id number]      [facility]              Acct #: [id number]      [address] [place], [place] [address]    Date: 06/13/2019 08: 11 AM                                 Adult  Male  Age: 42 yrs      ECHOCARDIOGRAM REPORT               Outpatient                                [facility]    STUDY:CHEST WALL        TAPE:          MD1: CALLWOOD, DWAYNE DENNIS    ECHO:Yes  DOPPLER:Yes    FILE:          BP: 124/86 mmHg    COLOR:Yes  CONTRAST:No   MACHINE:Philips  RV BIOPSY:No     3D:No SOUND QLTY:Moderate      Height: 69 in   MEDIUM:None                       Weight: 178 lb                                BSA: 2.0 m2 _________________________________________________________________________________________        HISTORY: DOE         REASON: Assess, LV function       INDICATION:         INDICATION:  INDICATION: SOB (shortness of                                   breath) [R06.02                                   (ICD-10-CM _________________________________________________________________________________________ ECHOCARDIOGRAPHIC MEASUREMENTS 2D DIMENSIONS AORTA         Values  Normal Range  MAIN PA     Values  Normal Range        Annulus: 2.0 cm    [2.3-2.9]     PA Main: nm*    [1.5-2.1]       Aorta Sin: 2.6 cm    [3.1-3.7]  RIGHT VENTRICLE      ST Junction: nm*     [2.6-3.2]     RV Base: nm*    [<4.2]       Asc.Aorta: nm*     [2.6-3.4]     RV Mid:  2.9 cm  [<3.5] LEFT VENTRICLE                   RV Length: nm*    [<8.6]         LVIDd: 4.5 cm    [4.2-5.9]  INFERIOR VENA CAVA         LVIDs: 3.4 cm            Max. IVC: nm*    [<=2.1]           FS: 24.7 %    [>25]      Min. IVC: nm*          SWT: 1.0 cm     [0.6-1.0]  ------------------          PWT: 0.80 cm   [0.6-1.0]  nm* - not measured LEFT ATRIUM        LA Diam: 3.7 cm    [3.0-4.0]      LA A4C Area: nm*     [<20]       LA Volume: nm*     [18-58] _________________________________________________________________________________________ ECHOCARDIOGRAPHIC DESCRIPTIONS AORTIC ROOT          Size: Normal       Dissection: INDETERM FOR DISSECTION AORTIC VALVE        Leaflets: Tricuspid          Morphology: Normal        Mobility: Fully mobile LEFT VENTRICLE          Size: Normal            Anterior: Normal      Contraction: Normal             Lateral: Normal       Closest EF: 50% (Estimated)         Septal: Normal       LV Masses: No Masses            Apical: Normal          LVH: None             Inferior: Normal                           Posterior: Normal      Dias.FxClass: N/A MITRAL VALVE        Leaflets: Normal            Mobility: Fully mobile       Morphology: Normal LEFT ATRIUM          Size: Normal            LA Masses: No masses       IA Septum: Normal IAS MAIN PA          Size: Normal PULMONIC VALVE       Morphology: Normal            Mobility: Fully mobile RIGHT VENTRICLE       RV Masses: No Masses             Size: Normal       Free Wall: Normal           Contraction: Normal TRICUSPID VALVE        Leaflets: Normal            Mobility: Fully mobile       Morphology: Normal RIGHT ATRIUM          Size: Normal  RA Other: None        RA Mass: No masses PERICARDIUM         Fluid: No effusion INFERIOR VENACAVA          Size: Normal Normal  respiratory collapse _________________________________________________________________________________________  DOPPLER ECHO and OTHER SPECIAL PROCEDURES         Aortic: No AR           No AS             112.7 cm/sec peak vel   5.1 mmHg peak grad         Mitral: TRIVIAL MR         No MS             6.4 cm^2 by DOPPLER             MV Inflow E Vel = 63.8 cm/sec   MV Annulus E'Vel = 8.7 cm/sec             E/E'Ratio = 7.3       Tricuspid: TRIVIAL TR         No TS       Pulmonary: TRIVIAL PR         No PS             139.8 cm/sec peak vel   7.8 mmHg peak grad _________________________________________________________________________________________ INTERPRETATION NORMAL LEFT VENTRICULAR SYSTOLIC FUNCTION NORMAL RIGHT VENTRICULAR SYSTOLIC FUNCTION TRIVIAL REGURGITATION NOTED (See above) NO VALVULAR STENOSIS TRIVIAL MR, TR, PR EF 50% _________________________________________________________________________________________ Electronically signed by  Dorothyann Peng, MD on 06/14/2019 09: 30 AM      Performed By: Merla Riches, RDCS   Ordering Physician: Dorothyann Peng _________________________________________________________________________________________  Other Result Information  Interface, Text Results In - 06/14/2019  9:31 AM EDT                     CARDIOLOGY DEPARTMENT                    ERDEM, NAAS           [facility]                                    [phone]           [facility]                           Acct #: [id number]           [address], [place], [place] [address]       Date: 06/13/2019 08: 11 AM                                                              Adult   Male   Age: 21 yrs           ECHOCARDIOGRAM REPORT                              Outpatient  [id number]      STUDY:CHEST WALL               TAPE:                    MD1: CALLWOOD, DWAYNE DENNIS       ECHO:Yes    DOPPLER:Yes       FILE:                    BP: 124/86 mmHg      COLOR:Yes   CONTRAST:No     MACHINE:Philips  RV BIOPSY:No          3D:No  SOUND QLTY:Moderate            Height: 69 in     MEDIUM:None                                              Weight: 178 lb                                                              BSA: 2.0 m2 _________________________________________________________________________________________               HISTORY: DOE                REASON: Assess, LV function            INDICATION:                  INDICATION:    INDICATION: SOB (shortness of                                                                    breath) [R06.02 (ICD-10-CM _________________________________________________________________________________________ ECHOCARDIOGRAPHIC MEASUREMENTS 2D DIMENSIONS AORTA                  Values   Normal Range   MAIN PA         Values    Normal Range               Annulus: 2.0 cm       [2.3-2.9]         PA Main: nm*       [1.5-2.1]             Aorta Sin: 2.6 cm       [3.1-3.7]    RIGHT VENTRICLE           ST Junction: nm*          [2.6-3.2]         RV Base: nm*       [<4.2]             Asc.Aorta: nm*          [2.6-3.4]          RV Mid: 2.9 cm    [<3.5] LEFT VENTRICLE  RV Length: nm*       [<8.6]                 LVIDd: 4.5 cm       [4.2-5.9]    INFERIOR VENA CAVA                 LVIDs: 3.4 cm                        Max. IVC: nm*       [<=2.1]                    FS: 24.7 %       [>25]            Min. IVC: nm*                   SWT: 1.0 cm       [0.6-1.0]    ------------------                   PWT: 0.80 cm      [0.6-1.0]    nm* - not measured LEFT ATRIUM               LA Diam: 3.7 cm       [3.0-4.0]           LA A4C Area: nm*          [<20]             LA Volume: nm*           [18-58] _________________________________________________________________________________________ ECHOCARDIOGRAPHIC DESCRIPTIONS AORTIC ROOT                  Size: Normal            Dissection: INDETERM FOR DISSECTION AORTIC VALVE              Leaflets: Tricuspid                   Morphology: Normal              Mobility: Fully mobile LEFT VENTRICLE                  Size: Normal                        Anterior: Normal           Contraction: Normal                         Lateral: Normal            Closest EF: 50% (Estimated)                 Septal: Normal             LV Masses: No Masses                       Apical: Normal                   LVH: None                          Inferior: Normal  Posterior: Normal          Dias.FxClass: N/A MITRAL VALVE              Leaflets: Normal                        Mobility: Fully mobile            Morphology: Normal LEFT ATRIUM                  Size: Normal                       LA Masses: No masses             IA Septum: Normal IAS MAIN PA                  Size: Normal PULMONIC VALVE            Morphology: Normal                        Mobility: Fully mobile RIGHT VENTRICLE             RV Masses: No Masses                         Size: Normal             Free Wall: Normal                     Contraction: Normal TRICUSPID VALVE              Leaflets: Normal                        Mobility: Fully mobile            Morphology: Normal RIGHT ATRIUM                  Size: Normal                        RA Other: None               RA Mass: No masses PERICARDIUM                 Fluid: No effusion INFERIOR VENACAVA                  Size: Normal Normal respiratory collapse _________________________________________________________________________________________  DOPPLER ECHO and OTHER SPECIAL PROCEDURES                Aortic: No AR                      No AS                        112.7 cm/sec  peak vel      5.1 mmHg peak grad                Mitral: TRIVIAL MR                 No MS                        6.4 cm^2 by DOPPLER  MV Inflow E Vel = 63.8 cm/sec     MV Annulus E'Vel = 8.7 cm/sec                        E/E'Ratio = 7.3             Tricuspid: TRIVIAL TR                 No TS             Pulmonary: TRIVIAL PR                 No PS                        139.8 cm/sec peak vel      7.8 mmHg peak grad _________________________________________________________________________________________ INTERPRETATION NORMAL LEFT VENTRICULAR SYSTOLIC FUNCTION NORMAL RIGHT VENTRICULAR SYSTOLIC FUNCTION TRIVIAL REGURGITATION NOTED (See above) NO VALVULAR STENOSIS TRIVIAL MR, TR, PR EF 50% _________________________________________________________________________________________ Electronically signed by    Dorothyann Pengwayne Callwood, MD on 06/14/2019 09: 30 AM          Performed By: Merla Richesoar, Christy C, RDCS    Ordering Physician: Dorothyann PengALLWOOD, DWAYNE      Results for orders placed or performed during the hospital encounter of 07/21/19  Basic metabolic panel  Result Value Ref Range   Sodium 139 135 - 145 mmol/L   Potassium 3.4 (L) 3.5 - 5.1 mmol/L   Chloride 104 98 - 111 mmol/L   CO2 26 22 - 32 mmol/L   Glucose, Bld 111 (H) 70 - 99 mg/dL   BUN 11 6 - 20 mg/dL   Creatinine, Ser 1.610.93 0.61 - 1.24 mg/dL   Calcium 8.9 8.9 - 09.610.3 mg/dL   GFR calc non Af Amer >60 >60 mL/min   GFR calc Af Amer >60 >60 mL/min   Anion gap 9 5 - 15  CBC  Result Value Ref Range   WBC 11.0 (H) 4.0 - 10.5 K/uL   RBC 5.39 4.22 - 5.81 MIL/uL   Hemoglobin 16.3 13.0 - 17.0 g/dL   HCT 04.546.9 40.939.0 - 81.152.0 %   MCV 87.0 80.0 - 100.0 fL   MCH 30.2 26.0 - 34.0 pg   MCHC 34.8 30.0 - 36.0 g/dL   RDW 91.412.4 78.211.5 - 95.615.5 %   Platelets 188 150 - 400 K/uL   nRBC 0.0 0.0 - 0.2 %  Troponin I (High Sensitivity)  Result Value Ref Range   Troponin I (High Sensitivity) 6 <18 ng/L      Assessment & Plan:   Problem List  Items Addressed This Visit    None    Visit Diagnoses    Chronic chest pain    -  Primary   Relevant Medications   cyclobenzaprine (FLEXERIL) 10 MG tablet   meloxicam (MOBIC) 15 MG tablet   traMADol (ULTRAM) 50 MG tablet   predniSONE (DELTASONE) 20 MG tablet   Radicular pain in left arm       Relevant Medications   predniSONE (DELTASONE) 20 MG tablet   Chronic left shoulder pain       Relevant Medications   cyclobenzaprine (FLEXERIL) 10 MG tablet   meloxicam (MOBIC) 15 MG tablet   traMADol (ULTRAM) 50 MG tablet   predniSONE (DELTASONE) 20 MG tablet      Persistent atypical chest pain x >2 months now [facility], multiple visits since 05/2019 has had ischemic work-up so  far with ECHO, EKG, Stress test and Lexiscan myoview without identified ischemia or other problem. Recent visit to ED patient left after triage without being seen by provider, had abnormality on Chest X-ray with pericardial fatpad prominence. Otherwise unremarkable testing preliminary. Radiology recommended CT chest as option.  Associated with L neck / shoulder pain some radicular pain, also followed by [facility] with this for x-rays and PT and treatment, anticipating further testing maybe X-ray / MRI of neck / shoulder next , has apt tomorrow  Plan - Trial on Prednisone 7 day taper, HOLD oral NSAIDs at this time. - Less likely to be GERD NSAID related but it is possible if taking due to shoulder/joint chest pain, seems to not have other key red flag symptoms for PUD  - Called Dr Juliann Pares [facility]) reviewed the case, and X-ray results, he agrees to follow with CT scan with contrast, I will order this STAT today and it can be scheduled. Independent of that work up he will have his office contact patient today and schedule close f/u apt with patient within 1-2 days to do further eval and likely Cardiac Cath as next option for the chest pain.  Patient notified of this  plan and agrees. He knows return criteria when to go back to hospital if emergent.  Orders Placed This Encounter  Procedures   CT Chest W Contrast    Please forward results to Dr Dorothyann Peng [facility])    Standing Status:   Future    Standing Expiration Date:   09/25/2020    Order Specific Question:   ** REASON FOR EXAM (FREE TEXT)    Answer:   chest pain persistent, rule out pulmonary embolism and also incidental abnormal pericardial fatpad found on x-ray recommended CT    Order Specific Question:   If indicated for the ordered procedure, I authorize the administration of contrast media per Radiology protocol    Answer:   Yes    Order Specific Question:   Preferred imaging location?    Answer:   [facility]    Order Specific Question:   Call Results- Best Contact Number?    Answer:   [phone]    Order Specific Question:   Radiology Contrast Protocol - do NOT remove file path    Answer:   \[email or web address]     Meds ordered this encounter  Medications   predniSONE (DELTASONE) 20 MG tablet    Sig: Take daily with food. Start with 60mg  (3 pills) x 2 days, then reduce to 40mg  (2 pills) x 2 days, then 20mg  (1 pill) x 3 days    Dispense:  13 tablet    Refill:  0     Follow up plan: Return in about 1 week (around 08/02/2019), or if symptoms worsen or fail to improve, for Follow-up as needed for chest pain within 1 week if unresolved, w/ cardiology.  Patient verbalizes understanding with the above medical recommendations including the limitation of remote medical advice.  Specific follow-up and call-back criteria were given for patient to follow-up or seek medical care more urgently if needed.  Total duration of direct patient care provided via telephone: 12 minutes   , DO [facility] 07/26/2019, 11:31 AM

## 2019-07-27 ENCOUNTER — Ambulatory Visit
Admission: RE | Admit: 2019-07-27 | Discharge: 2019-07-27 | Disposition: A | Discharge status: Home / Self Care | Payer: 59 | Source: Ambulatory Visit | Attending: Family Medicine | Admitting: Family Medicine

## 2019-07-27 ENCOUNTER — Ambulatory Visit: Payer: 59

## 2019-07-27 ENCOUNTER — Other Ambulatory Visit: Payer: Self-pay

## 2019-07-27 DIAGNOSIS — R072 Precordial pain: Secondary | ICD-10-CM | POA: Insufficient documentation

## 2019-07-27 MED ORDER — IOHEXOL 350 MG/ML SOLN
75.0000 mL | Freq: Once | INTRAVENOUS | Status: AC | PRN
  Administered 2019-07-27: 75 mL via INTRAVENOUS

## 2019-08-01 ENCOUNTER — Telehealth: Payer: Self-pay | Admitting: Family Medicine

## 2019-08-01 ENCOUNTER — Other Ambulatory Visit: Payer: 59

## 2019-08-01 DIAGNOSIS — M25512 Pain in left shoulder: Secondary | ICD-10-CM

## 2019-08-01 DIAGNOSIS — R079 Chest pain, unspecified: Secondary | ICD-10-CM

## 2019-08-01 DIAGNOSIS — G8929 Other chronic pain: Secondary | ICD-10-CM

## 2019-08-01 DIAGNOSIS — M792 Neuralgia and neuritis, unspecified: Secondary | ICD-10-CM

## 2019-08-01 MED ORDER — PREDNISONE 20 MG PO TABS: ORAL_TABLET | ORAL | 0 refills | Status: DC

## 2019-08-01 NOTE — Telephone Encounter (Signed)
Pt called wanted to know if you wanted him to go back on prednisone, medication did work he said that he was not having sharp pain on scale to 10 the  Pain was 4-5 now. Pt  Call back # is  [phone]

## 2019-08-01 NOTE — Telephone Encounter (Signed)
He is scheduled w/ Dr Clayborn Bigness [facility] for [facility] cardiac cath procedure on Friday 12/18. He had apt recently with him. Had CTA negative for PE.  I called him now and he said significant improvement on prednisone taper over 1 week, now has mild to moderate pain, he request to repeat 2nd week dose as we discussed, I reviewed all potential benefit risk harm side effect complication on prednisone, agree for 1 more week taper dose, rx sent.  He can follow up further with Dr Clayborn Bigness and has return precautions if any new or worsening symptoms develop. In future ultimately this may be an inflammatory MSK etiology of pain or symptoms, if it is responding to prednisone.  Nobie Putnam, [facility] 08/01/2019, 11:54 AM

## 2019-08-04 DIAGNOSIS — I200 Unstable angina: Secondary | ICD-10-CM

## 2019-08-21 ENCOUNTER — Other Ambulatory Visit: Admission: RE | Admit: 2019-08-21 | Payer: 59 | Source: Ambulatory Visit

## 2019-08-23 ENCOUNTER — Ambulatory Visit: Admission: RE | Admit: 2019-08-23 | Payer: 59 | Source: Home / Self Care | Admitting: Internal Medicine

## 2019-08-23 ENCOUNTER — Encounter: Admission: RE | Payer: Self-pay | Source: Home / Self Care

## 2019-08-23 SURGERY — LEFT HEART CATH AND CORONARY ANGIOGRAPHY
Anesthesia: Moderate Sedation | Laterality: Left

## 2019-08-24 DIAGNOSIS — M5412 Radiculopathy, cervical region: Secondary | ICD-10-CM

## 2019-08-25 MED ORDER — TRAMADOL HCL 50 MG PO TABS: 50.0000 mg | ORAL_TABLET | Freq: Four times a day (QID) | ORAL | 0 refills | Status: AC | PRN

## 2019-08-25 NOTE — Addendum Note (Signed)
Addended by: Smitty Cords on: 08/25/2019 01:25 PM   Modules accepted: Orders

## 2019-09-01 ENCOUNTER — Other Ambulatory Visit: Payer: Self-pay | Admitting: Family Medicine

## 2019-09-01 DIAGNOSIS — F411 Generalized anxiety disorder: Secondary | ICD-10-CM

## 2019-10-05 ENCOUNTER — Encounter: Payer: 59 | Admitting: Family Medicine

## 2019-10-08 ENCOUNTER — Other Ambulatory Visit: Payer: Self-pay | Admitting: Family Medicine

## 2019-10-08 DIAGNOSIS — M5412 Radiculopathy, cervical region: Secondary | ICD-10-CM

## 2019-10-11 ENCOUNTER — Ambulatory Visit (INDEPENDENT_AMBULATORY_CARE_PROVIDER_SITE_OTHER): Payer: 59 | Admitting: Family Medicine

## 2019-10-11 ENCOUNTER — Encounter: Payer: Self-pay | Admitting: Family Medicine

## 2019-10-11 ENCOUNTER — Other Ambulatory Visit: Payer: Self-pay

## 2019-10-11 VITALS — BP 132/85 | HR 91 | Temp 98.6°F | Resp 16 | Ht 69.0 in | Wt 179.0 lb

## 2019-10-11 DIAGNOSIS — Z0000 Encounter for general adult medical examination without abnormal findings: Secondary | ICD-10-CM | POA: Diagnosis not present

## 2019-10-11 DIAGNOSIS — R7309 Other abnormal glucose: Secondary | ICD-10-CM

## 2019-10-11 DIAGNOSIS — J432 Centrilobular emphysema: Secondary | ICD-10-CM

## 2019-10-11 DIAGNOSIS — E663 Overweight: Secondary | ICD-10-CM

## 2019-10-11 DIAGNOSIS — R7989 Other specified abnormal findings of blood chemistry: Secondary | ICD-10-CM

## 2019-10-11 DIAGNOSIS — Z8349 Family history of other endocrine, nutritional and metabolic diseases: Secondary | ICD-10-CM

## 2019-10-11 DIAGNOSIS — R635 Abnormal weight gain: Secondary | ICD-10-CM

## 2019-10-11 NOTE — Patient Instructions (Addendum)
Thank you for coming to the office today.  Check into insurance plan Medication Formulary - to see the tier list options, for COPD/Emphysema inhalers  - Breo - Symbicort - Advair - Elwin Sleight - Incruse - Spiriva  Look for lowe tier than Anoro and we can change the rx and hopefully reduce cost.  Labs today.  Everything but Testosterone - will need to be first thing in AM, come in any day within 1 week.  Order will be in the system, all you need to do is arrive for non fasting blood work.  We can refer to Urology if Testosterone is low next time.  Fatigue and other symptoms daily can be affected. Also sexual drive and dysfunction.  [facility] -1st floor [address] [place],  [place]  [address] Phone: [phone]  Please schedule a Follow-up Appointment to: Return in about 6 months (around 04/09/2020) for 6 month follow-up PreDM A1c, Testosterone, COPD Inhaler.  If you have any other questions or concerns, please feel free to call the office or send a message through MyChart. You may also schedule an earlier appointment if necessary.  Additionally, you may be receiving a survey about your experience at our office within a few days to 1 week by e-mail or mail. We value your feedback.  Saralyn Pilar, DO [facility], [place]

## 2019-10-11 NOTE — Progress Notes (Signed)
Subjective:    Patient ID: Lucas Peterson, male    DOB: 1975/09/28, 44 y.o.   MRN: [id number]  Lucas Peterson is a 44 y.o. male presenting on 10/11/2019 for Annual Exam   HPI   Here for Annual Physical and Lab Review.  Wellness NO known history of HTN or HLD in past [place] on improving lifestyle diet  Chronic Anxiety, generalized He admits concerns chronic history of some general anxiety at times. Says he has had anxiety issues for many years, not just recently during coronavirus pandemic or recently with current dyspnea and chest symptoms. He thinks he is an anxious person, never clinically diagnosed or treated before, he does not like medication.  Centrilobular Emphysema Active smoker On Anoro, says cost 60$, interested in other option. No recent flare up  Additional updates:  History of Low Testosterone Previous PCP tested labs including testosterone was told it was low, given testosterone injections then unsure if this helped. Reduced sex drive. Reduced erectile function. Request lab for Testosterone will return.  Pre-Diabetes Reports history of elevated A1c in past, no result available. Meds: None Denies hypoglycemia, polyuria, visual changes, numbness or tingling.   Waiting for surgery for shoulder and also cardiac catheterization   Health Maintenance: Declines flu UTD TDap  Depression screen [facility] 2/9 10/11/2019 02/28/2019  Decreased Interest 0 2  Down, Depressed, Hopeless 0 1  PHQ - 2 Score 0 3  Altered sleeping - 3  Tired, decreased energy - 2  Change in appetite - 0  Feeling bad or failure about yourself  - 0  Trouble concentrating - 2  Moving slowly or fidgety/restless - 0  Suicidal thoughts - 0  PHQ-9 Score - 10  Difficult doing work/chores - Somewhat difficult    Past Medical History:  Diagnosis Date  . Anxiety    History reviewed. No pertinent surgical history. Social History   Socioeconomic History  . Marital status: Single   Spouse name: Not on file  . Number of children: Not on file  . Years of education: [employer]  . Highest education level: High school graduate  Occupational History  . Not on file  Tobacco Use  . Smoking status: Current Every Day Smoker    Packs/day: 1.50    Years: 20.00    Pack years: 30.00    Types: Cigarettes  . Smokeless tobacco: Current User  . Tobacco comment: previously 0.5 to 1ppd, now 10 years 1.5 year  Substance and Sexual Activity  . Alcohol use: Never  . Drug use: Never  . Sexual activity: Not on file  Other Topics Concern  . Not on file  Social History Narrative  . Not on file   Social Determinants of Health   Financial Resource Strain:   . Difficulty of Paying Living Expenses: Not on file  Food Insecurity:   . Worried About Programme researcher, broadcasting/film/video in the Last Year: Not on file  . Ran Out of Food in the Last Year: Not on file  Transportation Needs:   . Lack of Transportation (Medical): Not on file  . Lack of Transportation (Non-Medical): Not on file  Physical Activity:   . Days of Exercise per Week: Not on file  . Minutes of Exercise per Session: Not on file  Stress:   . Feeling of Stress : Not on file  Social Connections:   . Frequency of Communication with Friends and Family: Not on file  . Frequency of Social Gatherings with Friends and Family: Not  on file  . Attends Religious Services: Not on file  . Active Member of Clubs or Organizations: Not on file  . Attends Archivist Meetings: Not on file  . Marital Status: Not on file  Intimate Partner Violence:   . Fear of Current or Ex-Partner: Not on file  . Emotionally Abused: Not on file  . Physically Abused: Not on file  . Sexually Abused: Not on file   Family History  Problem Relation Age of Onset  . Stroke Mother 31  . Diabetes Mother   . Stroke Father 64  . Thyroid disease Sister   . Diabetes Brother    Current Outpatient Medications on File Prior to Visit  Medication Sig  . ANORO  ELLIPTA 62.5-25 MCG/INH AEPB TAKE 1 PUFF BY MOUTH EVERY DAY (Patient taking differently: Inhale 1 puff into the lungs daily. )  . hydrOXYzine (ATARAX/VISTARIL) 25 MG tablet TAKE 0.5-1 TABLETS (12.5-25 MG TOTAL) BY MOUTH 2 (TWO) TIMES DAILY AS NEEDED FOR ANXIETY (SLEEP).  . Aspirin-Salicylamide-Caffeine (BC FAST PAIN RELIEF) 650-195-33.3 MG PACK Take 1 packet by mouth as needed (pain).   . VENTOLIN HFA 108 (90 Base) MCG/ACT inhaler INHALE 2 PUFFS INTO THE LUNGS EVERY 4 (FOUR) HOURS AS NEEDED FOR WHEEZING OR SHORTNESS OF BREATH (COUGH). (Patient not taking: No sig reported)   No current facility-administered medications on file prior to visit.    Review of Systems  Constitutional: Positive for fatigue. Negative for activity change, appetite change, chills, diaphoresis and fever.  HENT: Negative for congestion and hearing loss.   Eyes: Negative for visual disturbance.  Respiratory: Negative for cough, chest tightness, shortness of breath and wheezing.   Cardiovascular: Negative for chest pain, palpitations and leg swelling.  Gastrointestinal: Negative for abdominal pain, constipation, diarrhea, nausea and vomiting.  Endocrine: Negative for cold intolerance.  Genitourinary: Negative for dysuria, frequency and hematuria.  Musculoskeletal: Negative for arthralgias, back pain and neck pain.  Skin: Negative for rash.  Allergic/Immunologic: Negative for environmental allergies.  Neurological: Negative for dizziness, weakness, light-headedness, numbness and headaches.  Hematological: Negative for adenopathy.  Psychiatric/Behavioral: Positive for sleep disturbance. Negative for behavioral problems, decreased concentration and dysphoric mood. The patient is nervous/anxious.    Per HPI unless specifically indicated above      Objective:    BP 132/85   Pulse 91   Temp 98.6 F (37 C) (Temporal)   Resp 16   Ht 5\' 9"  (1.753 m)   Wt 179 lb (81.2 kg)   BMI 26.43 kg/m   Wt Readings from Last 3  Encounters:  10/11/19 179 lb (81.2 kg)  07/21/19 180 lb (81.6 kg)  05/19/19 175 lb (79.4 kg)    Physical Exam Vitals and nursing note reviewed.  Constitutional:      General: He is not in acute distress.    Appearance: He is well-developed. He is not diaphoretic.     Comments: Well-appearing, comfortable, cooperative  HENT:     Head: Normocephalic and atraumatic.  Eyes:     General:        Right eye: No discharge.        Left eye: No discharge.     Conjunctiva/sclera: Conjunctivae normal.     Pupils: Pupils are equal, round, and reactive to light.  Neck:     Thyroid: No thyromegaly.     Vascular: No carotid bruit.  Cardiovascular:     Rate and Rhythm: Normal rate and regular rhythm.     Heart sounds: Normal heart sounds.  No murmur.  Pulmonary:     Effort: Pulmonary effort is normal. No respiratory distress.     Breath sounds: Normal breath sounds. No wheezing or rales.  Abdominal:     General: Bowel sounds are normal. There is no distension.     Palpations: Abdomen is soft. There is no mass.     Tenderness: There is no abdominal tenderness.  Musculoskeletal:        General: No tenderness. Normal range of motion.     Cervical back: Normal range of motion and neck supple.     Comments: Upper / Lower Extremities: - Normal muscle tone, strength bilateral upper extremities 5/5, lower extremities 5/5  Lymphadenopathy:     Cervical: No cervical adenopathy.  Skin:    General: Skin is warm and dry.     Findings: No erythema or rash.  Neurological:     Mental Status: He is alert and oriented to person, place, and time.     Comments: Distal sensation intact to light touch all extremities  Psychiatric:        Behavior: Behavior normal.     Comments: Well groomed, good eye contact, normal speech and thoughts       Results for orders placed or performed in visit on 10/11/19  CBC with Differential/Platelet  Result Value Ref Range   WBC 11.1 (H) 3.8 - 10.8 Thousand/uL   RBC  5.45 4.20 - 5.80 Million/uL   Hemoglobin 16.8 13.2 - 17.1 g/dL   HCT 72.9 02.1 - 11.5 %   MCV 90.5 80.0 - 100.0 fL   MCH 30.8 27.0 - 33.0 pg   MCHC 34.1 32.0 - 36.0 g/dL   RDW 52.0 80.2 - 23.3 %   Platelets 188 140 - 400 Thousand/uL   MPV 10.3 7.5 - 12.5 fL   Neutro Abs 8,092 (H) 1,500 - 7,800 cells/uL   Lymphs Abs 2,031 850 - 3,900 cells/uL   Absolute Monocytes 644 200 - 950 cells/uL   Eosinophils Absolute 244 15 - 500 cells/uL   Basophils Absolute 89 0 - 200 cells/uL   Neutrophils Relative % 72.9 %   Total Lymphocyte 18.3 %   Monocytes Relative 5.8 %   Eosinophils Relative 2.2 %   Basophils Relative 0.8 %  COMPLETE METABOLIC PANEL WITH GFR  Result Value Ref Range   Glucose, Bld 79 65 - 99 mg/dL   BUN 9 7 - 25 mg/dL   Creat 6.12 2.44 - 9.75 mg/dL   GFR, Est Non African American 117 > OR = 60 mL/min/1.37m2   GFR, Est African American 135 > OR = 60 mL/min/1.66m2   BUN/Creatinine Ratio NOT APPLICABLE 6 - 22 (calc)   Sodium 141 135 - 146 mmol/L   Potassium 4.0 3.5 - 5.3 mmol/L   Chloride 106 98 - 110 mmol/L   CO2 27 20 - 32 mmol/L   Calcium 9.2 8.6 - 10.3 mg/dL   Total Protein 6.3 6.1 - 8.1 g/dL   Albumin 4.0 3.6 - 5.1 g/dL   Globulin 2.3 1.9 - 3.7 g/dL (calc)   AG Ratio 1.7 1.0 - 2.5 (calc)   Total Bilirubin 0.5 0.2 - 1.2 mg/dL   Alkaline phosphatase (APISO) 76 36 - 130 U/L   AST 20 10 - 40 U/L   ALT 26 9 - 46 U/L  Lipid panel  Result Value Ref Range   Cholesterol 163 <200 mg/dL   HDL 33 (L) > OR = 40 mg/dL   Triglycerides 300 (H) <150 mg/dL  LDL Cholesterol (Calc) 88 mg/dL (calc)   Total CHOL/HDL Ratio 4.9 <5.0 (calc)   Non-HDL Cholesterol (Calc) 130 (H) <130 mg/dL (calc)  TSH  Result Value Ref Range   TSH 1.65 0.40 - 4.50 mIU/L  T4, free  Result Value Ref Range   Free T4 0.9 0.8 - 1.8 ng/dL      Assessment & Plan:   Problem List Items Addressed This Visit    Elevated hemoglobin A1c   Relevant Orders   Hemoglobin A1c   COMPLETE METABOLIC PANEL WITH GFR  (Completed)   Centrilobular emphysema (HCC)    Other Visit Diagnoses    Annual physical exam    -  Primary   Relevant Orders   Hemoglobin A1c   CBC with Differential/Platelet (Completed)   COMPLETE METABOLIC PANEL WITH GFR (Completed)   Lipid panel (Completed)   Overweight (BMI 25.0-29.9)       Relevant Orders   COMPLETE METABOLIC PANEL WITH GFR (Completed)   TSH (Completed)   T4, free (Completed)   Low testosterone in male       Family history of thyroid disease       Relevant Orders   TSH (Completed)   T4, free (Completed)   Abnormal weight gain       Relevant Orders   TSH (Completed)   T4, free (Completed)       Updated Health Maintenance information Reviewed recent lab results with patient Encouraged improvement to lifestyle with diet and exercise Maintain healthy weight  #COPD Continue Anoro, check insurance formulary notify office if need to switch  #Low T Symptoms of fatigue and sexual dysfunction Return for lab testosterone in AM Refer to uro if low    No orders of the defined types were placed in this encounter.     Follow up plan: Return in about 6 months (around 04/09/2020) for 6 month follow-up PreDM A1c, Testosterone, COPD Inhaler.  Saralyn Pilar, DO [facility] 10/11/2019, 11:08 AM

## 2019-10-12 ENCOUNTER — Other Ambulatory Visit: Payer: Self-pay | Admitting: Family Medicine

## 2019-10-12 DIAGNOSIS — R7989 Other specified abnormal findings of blood chemistry: Secondary | ICD-10-CM

## 2019-10-12 LAB — CBC WITH DIFFERENTIAL/PLATELET
Absolute Monocytes: 644 cells/uL (ref 200–950)
Basophils Absolute: 89 cells/uL (ref 0–200)
Basophils Relative: 0.8 %
Eosinophils Absolute: 244 cells/uL (ref 15–500)
Eosinophils Relative: 2.2 %
HCT: 49.3 % (ref 38.5–50.0)
Hemoglobin: 16.8 g/dL (ref 13.2–17.1)
Lymphs Abs: 2031 cells/uL (ref 850–3900)
MCH: 30.8 pg (ref 27.0–33.0)
MCHC: 34.1 g/dL (ref 32.0–36.0)
MCV: 90.5 fL (ref 80.0–100.0)
MPV: 10.3 fL (ref 7.5–12.5)
Monocytes Relative: 5.8 %
Neutro Abs: 8092 cells/uL — ABNORMAL HIGH (ref 1500–7800)
Neutrophils Relative %: 72.9 %
Platelets: 188 10*3/uL (ref 140–400)
RBC: 5.45 10*6/uL (ref 4.20–5.80)
RDW: 12.3 % (ref 11.0–15.0)
Total Lymphocyte: 18.3 %
WBC: 11.1 10*3/uL — ABNORMAL HIGH (ref 3.8–10.8)

## 2019-10-12 LAB — LIPID PANEL
Cholesterol: 163 mg/dL (ref <200)
HDL: 33 mg/dL — ABNORMAL LOW (ref >=40)
LDL Cholesterol (Calc): 88 mg/dL (calc)
Non-HDL Cholesterol (Calc): 130 mg/dL (calc) — ABNORMAL HIGH (ref <130)
Total CHOL/HDL Ratio: 4.9 (calc) (ref <5.0)
Triglycerides: 317 mg/dL — ABNORMAL HIGH (ref <150)

## 2019-10-12 LAB — COMPLETE METABOLIC PANEL WITH GFR
AG Ratio: 1.7 (calc) (ref 1.0–2.5)
ALT: 26 U/L (ref 9–46)
AST: 20 U/L (ref 10–40)
Albumin: 4 g/dL (ref 3.6–5.1)
Alkaline phosphatase (APISO): 76 U/L (ref 36–130)
BUN: 9 mg/dL (ref 7–25)
CO2: 27 mmol/L (ref 20–32)
Calcium: 9.2 mg/dL (ref 8.6–10.3)
Chloride: 106 mmol/L (ref 98–110)
Creat: 0.71 mg/dL (ref 0.60–1.35)
GFR, Est African American: 135 mL/min/{1.73_m2} (ref >=60)
GFR, Est Non African American: 117 mL/min/{1.73_m2} (ref >=60)
Globulin: 2.3 g/dL (calc) (ref 1.9–3.7)
Glucose, Bld: 79 mg/dL (ref 65–99)
Potassium: 4 mmol/L (ref 3.5–5.3)
Sodium: 141 mmol/L (ref 135–146)
Total Bilirubin: 0.5 mg/dL (ref 0.2–1.2)
Total Protein: 6.3 g/dL (ref 6.1–8.1)

## 2019-10-12 LAB — TSH: TSH: 1.65 mIU/L (ref 0.40–4.50)

## 2019-10-12 LAB — HEMOGLOBIN A1C
Hgb A1c MFr Bld: 4.8 % of total Hgb (ref <5.7)
Mean Plasma Glucose: 91 (calc)
eAG (mmol/L): 5 (calc)

## 2019-10-12 LAB — T4, FREE: Free T4: 0.9 ng/dL (ref 0.8–1.8)

## 2019-10-19 ENCOUNTER — Ambulatory Visit (INDEPENDENT_AMBULATORY_CARE_PROVIDER_SITE_OTHER): Payer: 59 | Admitting: Family Medicine

## 2019-10-19 ENCOUNTER — Other Ambulatory Visit: Payer: Self-pay

## 2019-10-19 ENCOUNTER — Encounter: Payer: Self-pay | Admitting: Family Medicine

## 2019-10-19 VITALS — BP 126/71 | HR 88 | Temp 98.2°F | Resp 16 | Ht 69.0 in | Wt 181.0 lb

## 2019-10-19 DIAGNOSIS — R0600 Dyspnea, unspecified: Secondary | ICD-10-CM

## 2019-10-19 DIAGNOSIS — R072 Precordial pain: Secondary | ICD-10-CM

## 2019-10-19 DIAGNOSIS — R0609 Other forms of dyspnea: Secondary | ICD-10-CM

## 2019-10-19 DIAGNOSIS — R7989 Other specified abnormal findings of blood chemistry: Secondary | ICD-10-CM | POA: Diagnosis not present

## 2019-10-19 NOTE — Progress Notes (Signed)
Subjective:    Patient ID: Lucas Peterson, male    DOB: 08-31-1975, 44 y.o.   MRN: [id number]  Lucas Peterson is a 44 y.o. male presenting on 10/19/2019 for Low testosterone   HPI   History of Low Testosterone Previous PCP tested labs including testosterone was told it was low, given testosterone injections then unsure if this helped. Currently he is experiencing reduced sex drive. Reduced erectile function. No recent testosterone testing. Request lab today  Dyspnea on Exertion / Chest Pain Atypical History of Angina Followed by Dr Olevia Perches Cardiology Has upcoming cardiac catheterization scheduled for 11/01/19, he has had prior work up indeterminate. Pre op requested labs were CMET CBC and BNP. He already had CMET CBC, needs BNP lab today sent to Dr Juliann Pares. He has no edema and no active chest pain. Currently no dyspnea. He also has history of COPD  Next he has surgery for shoulder/bicep 1 week later   Depression screen [facility] 2/9 10/19/2019 10/11/2019 02/28/2019  Decreased Interest 0 0 2  Down, Depressed, Hopeless 0 0 1  PHQ - 2 Score 0 0 3  Altered sleeping - - 3  Tired, decreased energy - - 2  Change in appetite - - 0  Feeling bad or failure about yourself  - - 0  Trouble concentrating - - 2  Moving slowly or fidgety/restless - - 0  Suicidal thoughts - - 0  PHQ-9 Score - - 10  Difficult doing work/chores - - Somewhat difficult    Social History   Tobacco Use  . Smoking status: Current Every Day Smoker    Packs/day: 1.50    Years: 20.00    Pack years: 30.00    Types: Cigarettes  . Smokeless tobacco: Current User  . Tobacco comment: previously 0.5 to 1ppd, now 10 years 1.5 year  Substance Use Topics  . Alcohol use: Never  . Drug use: Never    Review of Systems Per HPI unless specifically indicated above     Objective:    BP 126/71   Pulse 88   Temp 98.2 F (36.8 C) (Oral)   Resp 16   Ht 5\' 9"  (1.753 m)   Wt 181 lb (82.1 kg)   BMI 26.73 kg/m    Wt Readings from Last 3 Encounters:  10/19/19 181 lb (82.1 kg)  10/11/19 179 lb (81.2 kg)  07/21/19 180 lb (81.6 kg)    Physical Exam Vitals and nursing note reviewed.  Constitutional:      General: He is not in acute distress.    Appearance: He is well-developed. He is not diaphoretic.     Comments: Well-appearing, comfortable, cooperative  HENT:     Head: Normocephalic and atraumatic.  Eyes:     General:        Right eye: No discharge.        Left eye: No discharge.     Conjunctiva/sclera: Conjunctivae normal.  Cardiovascular:     Rate and Rhythm: Normal rate and regular rhythm.     Pulses: Normal pulses.     Heart sounds: Normal heart sounds. No murmur.  Pulmonary:     Effort: Pulmonary effort is normal.  Musculoskeletal:     Right lower leg: No edema.     Left lower leg: No edema.  Skin:    General: Skin is warm and dry.     Findings: No erythema or rash.  Neurological:     Mental Status: He is alert and oriented to person,  place, and time.  Psychiatric:        Behavior: Behavior normal.     Comments: Well groomed, good eye contact, normal speech and thoughts    Results for orders placed or performed in visit on 10/11/19  Hemoglobin A1c  Result Value Ref Range   Hgb A1c MFr Bld 4.8 <5.7 % of total Hgb   Mean Plasma Glucose 91 (calc)   eAG (mmol/L) 5.0 (calc)  CBC with Differential/Platelet  Result Value Ref Range   WBC 11.1 (H) 3.8 - 10.8 Thousand/uL   RBC 5.45 4.20 - 5.80 Million/uL   Hemoglobin 16.8 13.2 - 17.1 g/dL   HCT 49.3 38.5 - 50.0 %   MCV 90.5 80.0 - 100.0 fL   MCH 30.8 27.0 - 33.0 pg   MCHC 34.1 32.0 - 36.0 g/dL   RDW 12.3 11.0 - 15.0 %   Platelets 188 140 - 400 Thousand/uL   MPV 10.3 7.5 - 12.5 fL   Neutro Abs 8,092 (H) 1,500 - 7,800 cells/uL   Lymphs Abs 2,031 850 - 3,900 cells/uL   Absolute Monocytes 644 200 - 950 cells/uL   Eosinophils Absolute 244 15 - 500 cells/uL   Basophils Absolute 89 0 - 200 cells/uL   Neutrophils Relative % 72.9  %   Total Lymphocyte 18.3 %   Monocytes Relative 5.8 %   Eosinophils Relative 2.2 %   Basophils Relative 0.8 %  COMPLETE METABOLIC PANEL WITH GFR  Result Value Ref Range   Glucose, Bld 79 65 - 99 mg/dL   BUN 9 7 - 25 mg/dL   Creat 0.71 0.60 - 1.35 mg/dL   GFR, Est Non African American 117 > OR = 60 mL/min/1.2m2   GFR, Est African American 135 > OR = 60 mL/min/1.61m2   BUN/Creatinine Ratio NOT APPLICABLE 6 - 22 (calc)   Sodium 141 135 - 146 mmol/L   Potassium 4.0 3.5 - 5.3 mmol/L   Chloride 106 98 - 110 mmol/L   CO2 27 20 - 32 mmol/L   Calcium 9.2 8.6 - 10.3 mg/dL   Total Protein 6.3 6.1 - 8.1 g/dL   Albumin 4.0 3.6 - 5.1 g/dL   Globulin 2.3 1.9 - 3.7 g/dL (calc)   AG Ratio 1.7 1.0 - 2.5 (calc)   Total Bilirubin 0.5 0.2 - 1.2 mg/dL   Alkaline phosphatase (APISO) 76 36 - 130 U/L   AST 20 10 - 40 U/L   ALT 26 9 - 46 U/L  Lipid panel  Result Value Ref Range   Cholesterol 163 <200 mg/dL   HDL 33 (L) > OR = 40 mg/dL   Triglycerides 317 (H) <150 mg/dL   LDL Cholesterol (Calc) 88 mg/dL (calc)   Total CHOL/HDL Ratio 4.9 <5.0 (calc)   Non-HDL Cholesterol (Calc) 130 (H) <130 mg/dL (calc)  TSH  Result Value Ref Range   TSH 1.65 0.40 - 4.50 mIU/L  T4, free  Result Value Ref Range   Free T4 0.9 0.8 - 1.8 ng/dL      Assessment & Plan:   Problem List Items Addressed This Visit    DOE (dyspnea on exertion)   Relevant Orders   Brain natriuretic peptide    Other Visit Diagnoses    Low testosterone in male    -  Primary   Relevant Orders   Testosterone   Precordial pain       Relevant Orders   Brain natriuretic peptide      #Low Testosterone, history Concern w/ ED and  reduced sexual drive and some fatigue WIll check AM testosterone lab today < 9am, and follow-up result May warrant return to Urology if still significantly low. To discuss HRT  #Chest pain / DOE Currently asymptomatic, euvolemic today Managed by Cardiology Dr Juliann Pares, at [facility] Scheduled upcoming  Cardiac Cath on 11/01/19, he needs pre op blood work already had CMET and CBC Results are above, slight elevated WBC and normal Hgb/Hct, normal Chemistry Add BNP today at request of cardiology, will fax result once available.   fax to Dr Juliann Pares Hershal Coria [phone]  Orders Placed This Encounter  Procedures  . Brain natriuretic peptide  . Testosterone     No orders of the defined types were placed in this encounter.     Follow up plan: Return if symptoms worsen or fail to improve.   Saralyn Pilar, DO [facility] 10/19/2019, 8:23 AM

## 2019-10-19 NOTE — Patient Instructions (Addendum)
Thank you for coming to the office today.  Checking Testosterone and BNP today  Will fax lab results CMET, CBC, BNP  to : ATTN Leta Speller, Jorja Loa, MD  [address]  [place], [place] [address]  [phone]  [phone] (Fax)   Please schedule a Follow-up Appointment to: Return if symptoms worsen or fail to improve.  If you have any other questions or concerns, please feel free to call the office or send a message through MyChart. You may also schedule an earlier appointment if necessary.  Additionally, you may be receiving a survey about your experience at our office within a few days to 1 week by e-mail or mail. We value your feedback.  Saralyn Pilar, DO [facility], [place]

## 2019-10-20 ENCOUNTER — Encounter: Payer: Self-pay | Admitting: Family Medicine

## 2019-10-20 LAB — BRAIN NATRIURETIC PEPTIDE: Brain Natriuretic Peptide: <4 pg/mL (ref <100)

## 2019-10-20 LAB — TESTOSTERONE: Testosterone: 585 ng/dL (ref 250–827)

## 2019-10-29 ENCOUNTER — Other Ambulatory Visit: Payer: Self-pay | Admitting: Family Medicine

## 2019-10-29 DIAGNOSIS — J432 Centrilobular emphysema: Secondary | ICD-10-CM

## 2019-10-30 ENCOUNTER — Other Ambulatory Visit
Admission: RE | Admit: 2019-10-30 | Discharge: 2019-10-30 | Disposition: A | Discharge status: Home / Self Care | Payer: 59 | Source: Ambulatory Visit | Attending: Internal Medicine | Admitting: Internal Medicine

## 2019-10-30 ENCOUNTER — Other Ambulatory Visit: Payer: Self-pay

## 2019-10-30 DIAGNOSIS — Z01812 Encounter for preprocedural laboratory examination: Secondary | ICD-10-CM | POA: Insufficient documentation

## 2019-10-30 DIAGNOSIS — Z20822 Contact with and (suspected) exposure to covid-19: Secondary | ICD-10-CM | POA: Insufficient documentation

## 2019-10-30 LAB — SARS CORONAVIRUS 2 (TAT 6-24 HRS): SARS Coronavirus 2: NEGATIVE

## 2019-11-01 ENCOUNTER — Inpatient Hospital Stay: Admission: RE | Admit: 2019-11-01 | Payer: 59 | Source: Ambulatory Visit

## 2019-11-01 ENCOUNTER — Ambulatory Visit: Admission: RE | Admit: 2019-11-01 | Payer: 59 | Source: Home / Self Care | Admitting: Internal Medicine

## 2019-11-01 ENCOUNTER — Other Ambulatory Visit: Payer: Self-pay | Admitting: *Deleted

## 2019-11-01 ENCOUNTER — Encounter: Admission: RE | Payer: Self-pay | Source: Home / Self Care

## 2019-11-01 ENCOUNTER — Other Ambulatory Visit: Payer: Self-pay | Admitting: Internal Medicine

## 2019-11-01 DIAGNOSIS — R079 Chest pain, unspecified: Secondary | ICD-10-CM

## 2019-11-01 DIAGNOSIS — R0789 Other chest pain: Secondary | ICD-10-CM

## 2019-11-01 SURGERY — LEFT HEART CATH AND CORONARY ANGIOGRAPHY
Anesthesia: Moderate Sedation | Laterality: Left

## 2019-11-02 ENCOUNTER — Other Ambulatory Visit: Payer: Self-pay | Admitting: Internal Medicine

## 2019-11-02 ENCOUNTER — Ambulatory Visit
Admission: RE | Admit: 2019-11-02 | Discharge: 2019-11-02 | Disposition: A | Discharge status: Home / Self Care | Payer: 59 | Source: Ambulatory Visit | Attending: Internal Medicine | Admitting: Internal Medicine

## 2019-11-02 ENCOUNTER — Other Ambulatory Visit: Payer: Self-pay | Admitting: Family Medicine

## 2019-11-02 ENCOUNTER — Other Ambulatory Visit: Payer: Self-pay

## 2019-11-02 DIAGNOSIS — R0600 Dyspnea, unspecified: Secondary | ICD-10-CM

## 2019-11-02 DIAGNOSIS — R079 Chest pain, unspecified: Secondary | ICD-10-CM | POA: Insufficient documentation

## 2019-11-02 DIAGNOSIS — R0789 Other chest pain: Secondary | ICD-10-CM

## 2019-11-02 DIAGNOSIS — F411 Generalized anxiety disorder: Secondary | ICD-10-CM

## 2019-11-02 MED ORDER — DILTIAZEM HCL 25 MG/5ML IV SOLN
10.0000 mg | Freq: Once | INTRAVENOUS | Status: AC
  Administered 2019-11-02: 10 mg via INTRAVENOUS

## 2019-11-02 MED ORDER — NITROGLYCERIN 0.4 MG SL SUBL
0.8000 mg | SUBLINGUAL_TABLET | Freq: Once | SUBLINGUAL | Status: AC
  Administered 2019-11-02: 0.8 mg via SUBLINGUAL

## 2019-11-02 MED ORDER — METOPROLOL TARTRATE 5 MG/5ML IV SOLN
10.0000 mg | INTRAVENOUS | Status: DC | PRN
  Administered 2019-11-02 (×3): 10 mg via INTRAVENOUS

## 2019-11-02 MED ORDER — IOHEXOL 350 MG/ML SOLN
75.0000 mL | Freq: Once | INTRAVENOUS | Status: AC | PRN
  Administered 2019-11-02: 75 mL via INTRAVENOUS

## 2019-11-08 ENCOUNTER — Other Ambulatory Visit: Payer: Self-pay | Admitting: *Deleted

## 2019-11-08 DIAGNOSIS — I208 Other forms of angina pectoris: Secondary | ICD-10-CM

## 2019-11-21 ENCOUNTER — Other Ambulatory Visit: Payer: Self-pay | Admitting: Family Medicine

## 2019-11-21 DIAGNOSIS — J432 Centrilobular emphysema: Secondary | ICD-10-CM

## 2019-11-22 ENCOUNTER — Encounter (HOSPITAL_COMMUNITY): Payer: Self-pay

## 2019-11-22 ENCOUNTER — Telehealth: Payer: 59 | Admitting: Nurse Practitioner

## 2019-11-22 ENCOUNTER — Telehealth (HOSPITAL_COMMUNITY): Payer: Self-pay | Admitting: Emergency Medicine

## 2019-11-22 DIAGNOSIS — J301 Allergic rhinitis due to pollen: Secondary | ICD-10-CM

## 2019-11-22 MED ORDER — PREDNISONE 20 MG PO TABS: ORAL_TABLET | ORAL | 0 refills | Status: DC

## 2019-11-22 MED ORDER — FLUTICASONE PROPIONATE 50 MCG/ACT NA SUSP: 2.0000 | Freq: Every day | NASAL | 6 refills | Status: DC

## 2019-11-22 NOTE — Progress Notes (Signed)
E visit for Allergic Rhinitis We are sorry that you are not feeling well.  Here is how we plan to help!  Based on what you have shared with me it looks like you have Allergic Rhinitis.  Rhinitis is when a reaction occurs that causes nasal congestion, runny nose, sneezing, and itching.  Most types of rhinitis are caused by an inflammation and are associated with symptoms in the eyes ears or throat. There are several types of rhinitis.  The most common are acute rhinitis, which is usually caused by a viral illness, allergic or seasonal rhinitis, and nonallergic or year-round rhinitis.  Nasal allergies occur certain times of the year.  Allergic rhinitis is caused when allergens in the air trigger the release of histamine in the body.  Histamine causes itching, swelling, and fluid to build up in the fragile linings of the nasal passages, sinuses and eyelids.  An itchy nose and clear discharge are common.  I recommend the following over the counter treatments: Clarinex 5 mg take 1 tablet daily (NOTE: Do not take if pregnant or breastfeeding)  I also would recommend a nasal spray: prescription sent to the pharmacy Flonase 2 sprays into each nostril once daily  You may also benefit from eye drops such as: Systane 1-2 driops each eye twice daily as needed  Shortness of Breath: prescription sent to the [facility] Prednisone 20mg  2 tablets PO 2x a day for 5 days  HOME CARE:   You can use an over-the-counter saline nasal spray as needed  Avoid areas where there is heavy dust, mites, or molds  Stay indoors on windy days during the pollen season  Keep windows closed in home, at least in bedroom; use air conditioner.  Use high-efficiency house air filter  Keep windows closed in car, turn AC on re-circulate  Avoid playing out with dog during pollen season  GET HELP RIGHT AWAY IF:   If your symptoms do not improve within 10 days  You become short of breath  You develop yellow or green  discharge from your nose for over 3 days  You have coughing fits  MAKE SURE YOU:   Understand these instructions  Will watch your condition  Will get help right away if you are not doing well or get worse  Thank you for choosing an e-visit. Your e-visit answers were reviewed by a board certified advanced clinical practitioner to complete your personal care plan. Depending upon the condition, your plan could have included both over the counter or prescription medications. Please review your pharmacy choice. Be sure that the pharmacy you have chosen is open so that you can pick up your prescription now.  If there is a problem you may message your provider in MyChart to have the prescription routed to another pharmacy. Your safety is important to . If you have drug allergies check your prescription carefully.  For the next 24 hours, you can use MyChart to ask questions about today's visit, request a non-urgent call back, or ask for a work or school excuse from your e-visit provider. You will get an email in the next two days asking about your experience. I hope that your e-visit has been valuable and will speed your recovery.   5-10 minutes spent reviewing and documenting in chart.

## 2019-11-22 NOTE — Telephone Encounter (Signed)
Reaching out to patient to offer assistance regarding upcoming cardiac imaging study; pt verbalizes understanding of appt date/time, parking situation and where to check in, pre-test NPO status and medications ordered, and verified current allergies; name and call back number provided for further questions should they arise Brentyn Seehafer RN Navigator Cardiac Imaging [facility] [phone] office [phone] cell 

## 2019-11-23 ENCOUNTER — Ambulatory Visit: Admission: RE | Admit: 2019-11-23 | Payer: 59 | Source: Ambulatory Visit

## 2019-11-24 ENCOUNTER — Other Ambulatory Visit: Payer: 59

## 2020-03-03 ENCOUNTER — Other Ambulatory Visit: Payer: Self-pay

## 2020-03-03 ENCOUNTER — Emergency Department: Payer: 59

## 2020-03-03 DIAGNOSIS — R0789 Other chest pain: Secondary | ICD-10-CM | POA: Diagnosis present

## 2020-03-03 DIAGNOSIS — Z5321 Procedure and treatment not carried out due to patient leaving prior to being seen by health care provider: Secondary | ICD-10-CM | POA: Insufficient documentation

## 2020-03-03 LAB — BASIC METABOLIC PANEL
Anion gap: 10 (ref 5–15)
BUN: 16 mg/dL (ref 6–20)
CO2: 24 mmol/L (ref 22–32)
Calcium: 9.6 mg/dL (ref 8.9–10.3)
Chloride: 104 mmol/L (ref 98–111)
Creatinine, Ser: 0.87 mg/dL (ref 0.61–1.24)
GFR calc Af Amer: >60 mL/min (ref >60)
GFR calc non Af Amer: >60 mL/min (ref >60)
Glucose, Bld: 104 mg/dL — ABNORMAL HIGH (ref 70–99)
Potassium: 3.7 mmol/L (ref 3.5–5.1)
Sodium: 138 mmol/L (ref 135–145)

## 2020-03-03 LAB — CBC
HCT: 47.2 % (ref 39.0–52.0)
Hemoglobin: 16.6 g/dL (ref 13.0–17.0)
MCH: 31.2 pg (ref 26.0–34.0)
MCHC: 35.2 g/dL (ref 30.0–36.0)
MCV: 88.7 fL (ref 80.0–100.0)
Platelets: 197 10*3/uL (ref 150–400)
RBC: 5.32 MIL/uL (ref 4.22–5.81)
RDW: 12.2 % (ref 11.5–15.5)
WBC: 9.6 10*3/uL (ref 4.0–10.5)
nRBC: 0 % (ref 0.0–0.2)

## 2020-03-03 LAB — TROPONIN I (HIGH SENSITIVITY): Troponin I (High Sensitivity): 3 ng/L (ref <18)

## 2020-03-03 MED ORDER — SODIUM CHLORIDE 0.9% FLUSH: 3.0000 mL | Freq: Once | INTRAVENOUS | Status: DC

## 2020-03-03 NOTE — ED Triage Notes (Signed)
Pt comes via pOV from home with c/o left sided CP that radiates to back and jaw. Pt states some SOb. Pt states it is a sharp pain.  Pt states 7/10 pain. Pt states some nausea.

## 2020-03-03 NOTE — ED Notes (Signed)
Called pt to reassess vitals and did not get an answer.

## 2020-03-04 ENCOUNTER — Emergency Department
Admission: EM | Admit: 2020-03-04 | Discharge: 2020-03-04 | Disposition: A | Discharge status: Home / Self Care | Payer: 59 | Attending: Emergency Medicine | Admitting: Emergency Medicine

## 2020-03-04 NOTE — ED Notes (Signed)
No answer when called for vital sign recheck 

## 2020-03-18 ENCOUNTER — Other Ambulatory Visit: Payer: Self-pay | Admitting: Family Medicine

## 2020-03-18 DIAGNOSIS — M5412 Radiculopathy, cervical region: Secondary | ICD-10-CM

## 2020-03-18 NOTE — Telephone Encounter (Signed)
Requested medication (s) are due for refill today: Provider to review  Requested medication (s) are on the active medication list:   Yes  Future visit scheduled:   Yes in 3 wks   Last ordered: Non delegated refill   Requested Prescriptions  Pending Prescriptions Disp Refills   traMADol (ULTRAM) 50 MG tablet [Pharmacy Med Name: TRAMADOL HCL 50 MG TABLET] 20 tablet     Sig: Take 1 tablet (50 mg total) by mouth every 6 (six) hours as needed for up to 5 days for moderate pain.      Not Delegated - Analgesics:  Opioid Agonists Failed - 03/18/2020  2:09 PM      Failed - This refill cannot be delegated      Failed - Urine Drug Screen completed in last 360 days.      Passed - Valid encounter within last 6 months    Recent Outpatient Visits           5 months ago Low testosterone in male   [facility] [place], Netta Neat, DO   5 months ago Annual physical exam   [facility] Smitty Cords, DO   7 months ago Chronic chest pain   [facility] Smitty Cords, DO   1 year ago Centrilobular emphysema [facility])   [facility] Althea Charon, Netta Neat, DO       Future Appointments             In 3 weeks Althea Charon, Netta Neat, DO [facility], [facility]

## 2020-04-09 ENCOUNTER — Ambulatory Visit: Payer: 59 | Admitting: Family Medicine

## 2020-08-03 ENCOUNTER — Other Ambulatory Visit: Payer: Self-pay | Admitting: Nurse Practitioner

## 2020-08-03 DIAGNOSIS — J301 Allergic rhinitis due to pollen: Secondary | ICD-10-CM

## 2020-08-24 ENCOUNTER — Other Ambulatory Visit: Payer: Self-pay | Admitting: Family Medicine

## 2020-08-24 DIAGNOSIS — F411 Generalized anxiety disorder: Secondary | ICD-10-CM

## 2020-08-24 NOTE — Telephone Encounter (Signed)
Requested Prescriptions  Pending Prescriptions Disp Refills  . hydrOXYzine (ATARAX/VISTARIL) 25 MG tablet [Pharmacy Med Name: HYDROXYZINE HCL 25 MG TABLET] 90 tablet 0    Sig: TAKE 1 TABLET (25 MG TOTAL) BY MOUTH AT BEDTIME AS NEEDED (SLEEP).     Ear, Nose, and Throat:  Antihistamines Passed - 08/24/2020 12:51 AM      Passed - Valid encounter within last 12 months    Recent Outpatient Visits          10 months ago Low testosterone in male   [facility] [place], Netta Neat, DO   10 months ago Annual physical exam   [facility] Smitty Cords, DO   1 year ago Chronic chest pain   [facility] Smitty Cords, DO   1 year ago Centrilobular emphysema [facility] [place])   [facility] [place], Netta Neat, DO

## 2020-08-30 ENCOUNTER — Telehealth: Payer: 59 | Admitting: Family

## 2020-08-30 DIAGNOSIS — M5416 Radiculopathy, lumbar region: Secondary | ICD-10-CM

## 2020-08-30 MED ORDER — CYCLOBENZAPRINE HCL 10 MG PO TABS: 10.0000 mg | ORAL_TABLET | Freq: Three times a day (TID) | ORAL | 0 refills | Status: DC | PRN

## 2020-08-30 MED ORDER — NAPROXEN 500 MG PO TABS: 500.0000 mg | ORAL_TABLET | Freq: Two times a day (BID) | ORAL | 0 refills | Status: DC

## 2020-08-30 NOTE — Progress Notes (Signed)

## 2020-10-09 ENCOUNTER — Other Ambulatory Visit: Payer: Self-pay

## 2020-10-09 ENCOUNTER — Telehealth (INDEPENDENT_AMBULATORY_CARE_PROVIDER_SITE_OTHER): Payer: 59 | Admitting: Family Medicine

## 2020-10-09 ENCOUNTER — Encounter: Payer: Self-pay | Admitting: Family Medicine

## 2020-10-09 VITALS — Wt 175.0 lb

## 2020-10-09 DIAGNOSIS — N529 Male erectile dysfunction, unspecified: Secondary | ICD-10-CM | POA: Diagnosis not present

## 2020-10-09 MED ORDER — SILDENAFIL CITRATE 20 MG PO TABS: ORAL_TABLET | ORAL | 3 refills | Status: DC

## 2020-10-09 NOTE — Progress Notes (Unsigned)
Virtual Visit via Telephone The purpose of this virtual visit is to provide medical care while limiting exposure to the novel coronavirus (COVID19) for both patient and office staff.  Consent was obtained for phone visit:  Yes.   Answered questions that patient had about telehealth interaction:  Yes.   I discussed the limitations, risks, security and privacy concerns of performing an evaluation and management service by telephone. I also discussed with the patient that there may be a patient responsible charge related to this service. The patient expressed understanding and agreed to proceed.  Patient Location: Home Provider Location: [facility] (Office)  Participants in virtual visit: - Patient: Lucas Peterson. Perlie Gold - CMA: Burnell Blanks, CMA - Provider: Dr Althea Charon  ---------------------------------------------------------------------- Chief Complaint  Patient presents with  . Erectile Dysfunction    S: Reviewed CMA documentation. I have called patient and gathered additional HPI as follows:  Erectile Dysfunction Chronic issue, prior discussion on Low T in 2021, normal lab. Never on medication Not seen Urologist Reduced erectile function, not always able to obtain or maintain erection  Denies any fevers, chills, sweats, body ache, cough, shortness of breath, sinus pain or pressure, headache, abdominal pain, diarrhea  Past Medical History:  Diagnosis Date  . Anxiety    Social History   Tobacco Use  . Smoking status: Current Every Day Smoker    Packs/day: 1.50    Years: 20.00    Pack years: 30.00    Types: Cigarettes  . Smokeless tobacco: Never Used  . Tobacco comment: previously 0.5 to 1ppd, now 10 years 1.5 year  Vaping Use  . Vaping Use: Never used  Substance Use Topics  . Alcohol use: Never  . Drug use: Never    Current Outpatient Medications:  .  Aspirin-Salicylamide-Caffeine (BC FAST PAIN RELIEF) 650-195-33.3 MG PACK, Take 1 packet by  mouth 2 (two) times daily., Disp: , Rfl:  .  hydrOXYzine (ATARAX/VISTARIL) 25 MG tablet, TAKE 1 TABLET (25 MG TOTAL) BY MOUTH AT BEDTIME AS NEEDED (SLEEP)., Disp: 90 tablet, Rfl: 0 .  sildenafil (REVATIO) 20 MG tablet, Take 1-5 pills about 30 min prior to sex. Start with 1 and increase as needed., Disp: 90 tablet, Rfl: 3 .  VENTOLIN HFA 108 (90 Base) MCG/ACT inhaler, INHALE 2 PUFFS INTO THE LUNGS EVERY 4 (FOUR) HOURS AS NEEDED FOR WHEEZING OR SHORTNESS OF BREATH (COUGH)., Disp: 18 g, Rfl: 1  Depression screen [facility] 2/9 10/19/2019 10/11/2019 02/28/2019  Decreased Interest 0 0 2  Down, Depressed, Hopeless 0 0 1  PHQ - 2 Score 0 0 3  Altered sleeping - - 3  Tired, decreased energy - - 2  Change in appetite - - 0  Feeling bad or failure about yourself  - - 0  Trouble concentrating - - 2  Moving slowly or fidgety/restless - - 0  Suicidal thoughts - - 0  PHQ-9 Score - - 10  Difficult doing work/chores - - Somewhat difficult    GAD 7 : Generalized Anxiety Score 02/28/2019  Nervous, Anxious, on Edge 3  Control/stop worrying 2  Worry too much - different things 2  Trouble relaxing 3  Restless 2  Easily annoyed or irritable 3  Afraid - awful might happen 1  Total GAD 7 Score 16  Anxiety Difficulty Somewhat difficult    -------------------------------------------------------------------------- O: No physical exam performed due to remote telephone encounter.  Lab results reviewed.  No results found for this or any previous visit (from the past 2160 hour(s)).  --------------------------------------------------------------------------  A&P:  Problem List Items Addressed This Visit   None   Visit Diagnoses    Erectile dysfunction, unspecified erectile dysfunction type    -  Primary   Relevant Medications   sildenafil (REVATIO) 20 MG tablet     Consistent with likely multifactorial-related ED - without other risk factors, no evidence of vascular or neurogenic disease, normal blood sugar  previously - no prior trial on PDE5 inhibitors  Plan: 1. Trial on generic Sildenafil 20mg  tabs, take 1-5 tabs about 30 min prior to sexual activity, refills provided, goodrx 2. Follow-up as needed, future consider Urologist if unresolved    Meds ordered this encounter  Medications  . sildenafil (REVATIO) 20 MG tablet    Sig: Take 1-5 pills about 30 min prior to sex. Start with 1 and increase as needed.    Dispense:  90 tablet    Refill:  3    Follow-up: - Return as needed  Patient verbalizes understanding with the above medical recommendations including the limitation of remote medical advice.  Specific follow-up and call-back criteria were given for patient to follow-up or seek medical care more urgently if needed.   - Time spent in direct consultation with patient on phone: 9 minutes  , DO [facility] 10/09/2020, 3:47 PM

## 2020-10-10 ENCOUNTER — Other Ambulatory Visit: Payer: Self-pay | Admitting: Family

## 2020-11-25 ENCOUNTER — Other Ambulatory Visit: Payer: Self-pay | Admitting: Family Medicine

## 2020-11-25 ENCOUNTER — Other Ambulatory Visit: Payer: Self-pay | Admitting: Family

## 2020-11-25 DIAGNOSIS — F411 Generalized anxiety disorder: Secondary | ICD-10-CM

## 2020-11-25 NOTE — Telephone Encounter (Signed)
Requested Prescriptions  Pending Prescriptions Disp Refills  . hydrOXYzine (ATARAX/VISTARIL) 25 MG tablet [Pharmacy Med Name: HYDROXYZINE HCL 25 MG TABLET] 90 tablet 0    Sig: TAKE 1 TABLET (25 MG TOTAL) BY MOUTH AT BEDTIME AS NEEDED (SLEEP).     Ear, Nose, and Throat:  Antihistamines Passed - 11/25/2020  8:58 AM      Passed - Valid encounter within last 12 months    Recent Outpatient Visits          1 month ago Erectile dysfunction, unspecified erectile dysfunction type   [facility] [place], Netta Neat, DO   1 year ago Low testosterone in male   [facility] [place], Netta Neat, DO   1 year ago Annual physical exam   [facility] Smitty Cords, DO   1 year ago Chronic chest pain   [facility] Smitty Cords, DO   1 year ago Centrilobular emphysema [facility])   [facility] [place], Netta Neat, DO

## 2020-12-16 DIAGNOSIS — Z20822 Contact with and (suspected) exposure to covid-19: Secondary | ICD-10-CM | POA: Diagnosis not present

## 2021-01-14 ENCOUNTER — Other Ambulatory Visit: Payer: Self-pay | Admitting: Family

## 2021-02-21 ENCOUNTER — Other Ambulatory Visit: Payer: Self-pay | Admitting: Family Medicine

## 2021-02-21 DIAGNOSIS — F411 Generalized anxiety disorder: Secondary | ICD-10-CM

## 2021-02-21 NOTE — Telephone Encounter (Signed)
Requested medications are due for refill today yes  Requested medications are on the active medication list yes  Last refill 4/11  Last visit Do not see this med/dx addressed in OV note  Future visit scheduled No  Notes to clinic Please assess.

## 2021-05-26 ENCOUNTER — Other Ambulatory Visit: Payer: Self-pay | Admitting: Family Medicine

## 2021-05-26 DIAGNOSIS — F411 Generalized anxiety disorder: Secondary | ICD-10-CM

## 2021-05-26 NOTE — Telephone Encounter (Signed)
Requested Prescriptions  Pending Prescriptions Disp Refills  . hydrOXYzine (ATARAX/VISTARIL) 25 MG tablet [Pharmacy Med Name: HYDROXYZINE HCL 25 MG TABLET] 90 tablet 0    Sig: TAKE 1 TABLET (25 MG TOTAL) BY MOUTH AT BEDTIME AS NEEDED (SLEEP).     Ear, Nose, and Throat:  Antihistamines Passed - 05/26/2021  1:27 AM      Passed - Valid encounter within last 12 months    Recent Outpatient Visits          7 months ago Erectile dysfunction, unspecified erectile dysfunction type   [facility] [place], Netta Neat, DO   1 year ago Low testosterone in male   [facility] [place], Netta Neat, DO   1 year ago Annual physical exam   [facility] Smitty Cords, DO   1 year ago Chronic chest pain   [facility] Smitty Cords, DO   2 years ago Centrilobular emphysema [facility])   [facility] [place], Netta Neat, DO

## 2021-08-13 DIAGNOSIS — Z20822 Contact with and (suspected) exposure to covid-19: Secondary | ICD-10-CM | POA: Diagnosis not present

## 2021-08-13 DIAGNOSIS — R059 Cough, unspecified: Secondary | ICD-10-CM | POA: Diagnosis not present

## 2021-08-13 DIAGNOSIS — J069 Acute upper respiratory infection, unspecified: Secondary | ICD-10-CM | POA: Diagnosis not present

## 2022-01-05 ENCOUNTER — Other Ambulatory Visit: Payer: Self-pay | Admitting: Family Medicine

## 2022-01-05 DIAGNOSIS — N529 Male erectile dysfunction, unspecified: Secondary | ICD-10-CM

## 2022-01-05 NOTE — Telephone Encounter (Signed)
Medication Refill - Medication:  sildenafil (REVATIO) 20 MG tablet  Has the patient contacted their pharmacy? Yes.   Contact PCP  Preferred Pharmacy (with phone number or street name):  [facility] [address] [place] - [address]  [address] [place] [address]  Phone:  [phone]  Fax:  [phone]   Has the patient been seen for an appointment in the last year OR does the patient have an upcoming appointment? Yes.    Agent: Please be advised that RX refills may take up to 3 business days. We ask that you follow-up with your pharmacy.

## 2022-01-07 ENCOUNTER — Ambulatory Visit: Payer: 59 | Admitting: Family Medicine

## 2022-01-07 ENCOUNTER — Encounter: Payer: Self-pay | Admitting: Family Medicine

## 2022-01-07 VITALS — BP 132/74 | HR 81 | Ht 70.0 in | Wt 170.2 lb

## 2022-01-07 DIAGNOSIS — N529 Male erectile dysfunction, unspecified: Secondary | ICD-10-CM

## 2022-01-07 DIAGNOSIS — F411 Generalized anxiety disorder: Secondary | ICD-10-CM

## 2022-01-07 MED ORDER — ESCITALOPRAM OXALATE 10 MG PO TABS: 10.0000 mg | ORAL_TABLET | Freq: Every day | ORAL | 1 refills | Status: DC

## 2022-01-07 MED ORDER — SILDENAFIL CITRATE 20 MG PO TABS: ORAL_TABLET | ORAL | 4 refills | Status: DC

## 2022-01-07 MED ORDER — BUSPIRONE HCL 5 MG PO TABS: 5.0000 mg | ORAL_TABLET | Freq: Three times a day (TID) | ORAL | 1 refills | Status: DC | PRN

## 2022-01-07 NOTE — Patient Instructions (Addendum)
Thank you for coming to the office today.  As discussed, it sounds like your symptoms are primarily related to anxiety / adjustment disorder. This is a very common problem and be related to several factors, including life stressors. Start treatment with Escitalopram, take 10mg  daily for next. As discussed most anxiety medications are also used for mood disorders such as depression, because they work on similar chemicals in your brain. It may take up to 3-4 weeks for the medicine to take full effect and for you to notice a difference, sometimes you may notice it working sooner, otherwise we may need to adjust the dose.  Add Buspar 5mg  as needed, anxiety this is more of a quicker medicine, takes 30 min to 1 hr to work, lasts 4-6 hours maybe, can take up to max 3 in 24 hours, either spaced apart, or can do 2 or 3 at once.  For most patients with anxiety or mood concerns, we generally recommend referral to establish with a therapist or counselor as well. This has been shown to improve the effectiveness of the medications, and in the future we may be able to taper off medications.   We will discuss at future appointments.    These offices have both PSYCHIATRY doctors and THERAPISTS  [facility] (Virtual Available) [place] [address] [place], [address] [place] Phone: [phone]  [facility] Address: [address] [place], [address] [place] [email or web address] Phone: [phone]  [facility]) Address: [address], [employer], [address] [place] Hours: 8:30AM-5PM Phone: [phone]  [facility] [address] [place],  [facility]  [place] Phone: [phone] Fax: [phone]  [facility] [address] [place], [address] [place] Phone: [phone]  [facility]All [facility]) [address] [facility] [place], [place] Phone: [phone] (Option 1) [email or web address]  ----------------------------------------------------------------- THERAPIST ONLY  (No Psychiatry)  [facility] [address] [place], [address] [place] P: [phone]  [facility]   Address: [address] [place], [place], [place] Hours: Open today  9AM-7PM Phone: [phone]  [facility] [address] [facility]  - [facility] Address: [address], [place], [address] [place] Phone: [phone]  DUE for FASTING BLOOD WORK (no food or drink after midnight before the lab appointment, only water or coffee without cream/sugar on the morning of)  SCHEDULE "Lab Only" visit in the morning at the clinic for lab draw in 3 MONTHS   - Make sure Lab Only appointment is at about 1 week before your next appointment, so that results will be available  For Lab Results, once available within 2-3 days of blood draw, you can can log in to MyChart online to view your results and a brief explanation. Also, we can discuss results at next follow-up visit.     Please schedule a Follow-up Appointment to: Return in about 3 months (around 04/09/2022) for 3 month Annual Physical AM apt fasting lab AFTER (Updates anxiety).  If you have any other questions or concerns, please feel free to call the office or send a message through MyChart. You may also schedule an earlier appointment if necessary.  Additionally, you may be receiving a survey about your experience at our office within a few days to 1 week by e-mail or mail. We value your feedback.  [phone], DO  [place]

## 2022-01-07 NOTE — Progress Notes (Signed)
Subjective:    Patient ID: Lucas Peterson, male    DOB: 08/04/1976, 46 y.o.   MRN: [id number]  Lucas Peterson is a 46 y.o. male presenting on 01/07/2022 for Erectile Dysfunction and Anxiety   HPI  Erectile Dysfunction Chronic issue, prior discussion on Low T in 2021, normal lab. Never on medication Not seen Urologist Reduced erectile function, not always able to obtain or maintain erection  Last visit 09/2020 we have ordered Sildenafil 20mg , has done well, uses 3-4 per dose, ran out of med. Needs new refills.  Anxiety He has issues with feeling always on edge and anxious, and stressed. Admits stress at work. His 69 yr old daughter ADHD and med wears off around 5pm, he gets irritated easy and agitated. His daughter does like to aggravate and it can be difficult for him to relax. He may have taken Lexapro years ago >10 years ago.      01/07/2022    1:43 PM 10/19/2019    8:11 AM 10/11/2019   10:39 AM  Depression screen PHQ 2/9  Decreased Interest 0 0 0  Down, Depressed, Hopeless 1 0 0  PHQ - 2 Score 1 0 0  Altered sleeping 2    Tired, decreased energy 1    Change in appetite 0    Feeling bad or failure about yourself  0    Trouble concentrating 0    Moving slowly or fidgety/restless 0    Suicidal thoughts 0    PHQ-9 Score 4    Difficult doing work/chores Not difficult at all        01/07/2022    1:43 PM 02/28/2019    2:41 PM  GAD 7 : Generalized Anxiety Score  Nervous, Anxious, on Edge 2 3  Control/stop worrying 2 2  Worry too much - different things 2 2  Trouble relaxing 2 3  Restless 2 2  Easily annoyed or irritable 3 3  Afraid - awful might happen 2 1  Total GAD 7 Score 15 16  Anxiety Difficulty Somewhat difficult Somewhat difficult      Social History   Tobacco Use   Smoking status: Every Day    Packs/day: 1.50    Years: 20.00    Pack years: 30.00    Types: Cigarettes   Smokeless tobacco: Never   Tobacco comments:    previously 0.5 to 1ppd, now  10 years 1.5 year  Vaping Use   Vaping Use: Never used  Substance Use Topics   Alcohol use: Never   Drug use: Never    Review of Systems Per HPI unless specifically indicated above     Objective:    BP 132/74   Pulse 81   Ht 5\' 10"  (1.778 m)   Wt 170 lb 3.2 oz (77.2 kg)   SpO2 99%   BMI 24.42 kg/m   Wt Readings from Last 3 Encounters:  01/07/22 170 lb 3.2 oz (77.2 kg)  10/09/20 175 lb (79.4 kg)  03/03/20 175 lb (79.4 kg)    Physical Exam Vitals and nursing note reviewed.  Constitutional:      General: He is not in acute distress.    Appearance: Normal appearance. He is well-developed. He is not diaphoretic.     Comments: Well-appearing, comfortable, cooperative  HENT:     Head: Normocephalic and atraumatic.  Eyes:     General:        Right eye: No discharge.        Left eye: No  discharge.     Conjunctiva/sclera: Conjunctivae normal.  Cardiovascular:     Rate and Rhythm: Normal rate.  Pulmonary:     Effort: Pulmonary effort is normal.  Skin:    General: Skin is warm and dry.     Findings: No erythema or rash.  Neurological:     Mental Status: He is alert and oriented to person, place, and time.  Psychiatric:        Mood and Affect: Mood normal.        Behavior: Behavior normal.        Thought Content: Thought content normal.     Comments: Well groomed, good eye contact, normal speech and thoughts     Results for orders placed or performed during the hospital encounter of 03/04/20  Basic metabolic panel  Result Value Ref Range   Sodium 138 135 - 145 mmol/L   Potassium 3.7 3.5 - 5.1 mmol/L   Chloride 104 98 - 111 mmol/L   CO2 24 22 - 32 mmol/L   Glucose, Bld 104 (H) 70 - 99 mg/dL   BUN 16 6 - 20 mg/dL   Creatinine, Ser 1.610.87 0.61 - 1.24 mg/dL   Calcium 9.6 8.9 - 09.610.3 mg/dL   GFR calc non Af Amer >60 >60 mL/min   GFR calc Af Amer >60 >60 mL/min   Anion gap 10 5 - 15  CBC  Result Value Ref Range   WBC 9.6 4.0 - 10.5 K/uL   RBC 5.32 4.22 - 5.81 MIL/uL    Hemoglobin 16.6 13.0 - 17.0 g/dL   HCT 04.547.2 40.939.0 - 81.152.0 %   MCV 88.7 80.0 - 100.0 fL   MCH 31.2 26.0 - 34.0 pg   MCHC 35.2 30.0 - 36.0 g/dL   RDW 91.412.2 78.211.5 - 95.615.5 %   Platelets 197 150 - 400 K/uL   nRBC 0.0 0.0 - 0.2 %  Troponin I (High Sensitivity)  Result Value Ref Range   Troponin I (High Sensitivity) 3 <18 ng/L      Assessment & Plan:   Problem List Items Addressed This Visit     GAD (generalized anxiety disorder) - Primary   Relevant Medications   escitalopram (LEXAPRO) 10 MG tablet   busPIRone (BUSPAR) 5 MG tablet   Other Visit Diagnoses     Erectile dysfunction, unspecified erectile dysfunction type       Relevant Medications   sildenafil (REVATIO) 20 MG tablet       Anxiety No prior treatment before. No clear history mental health or diagnosis No psych/therapy  Start SSRI escitalopram 10mg  daily, counseling on potential ED side effect, however he is already on sildenafil. Add Buspar as PRN and can use more regularly if preferred.  ED Chronic problem for past few years May be stress/anxiety related. Improved on Sildenafil 3-4 pills, re order, goodrx, printed.  Meds ordered this encounter  Medications   sildenafil (REVATIO) 20 MG tablet    Sig: Take 2-5 pills about 30 min prior to sex    Dispense:  90 tablet    Refill:  4   escitalopram (LEXAPRO) 10 MG tablet    Sig: Take 1 tablet (10 mg total) by mouth daily.    Dispense:  90 tablet    Refill:  1   busPIRone (BUSPAR) 5 MG tablet    Sig: Take 1 tablet (5 mg total) by mouth 3 (three) times daily as needed (anxiety).    Dispense:  90 tablet    Refill:  1  Follow up plan: Return in about 3 months (around 04/09/2022) for 3 month Annual Physical AM apt fasting lab AFTER (Updates anxiety).   Saralyn Pilar, DO [facility] 01/07/2022, 1:53 PM

## 2022-01-07 NOTE — Telephone Encounter (Signed)
Requested medication (s) are due for refill today: yes  Requested medication (s) are on the active medication list: yes  Last refill:  10/09/20 #90 3 RF  Future visit scheduled: yes-today  Notes to clinic:  [facility]e labs   Requested Prescriptions  Pending Prescriptions Disp Refills   sildenafil (REVATIO) 20 MG tablet 90 tablet 3    Sig: Take 1-5 pills about 30 min prior to sex. Start with 1 and increase as needed.     Urology: Erectile Dysfunction Agents Failed - 01/06/2022  3:39 PM      Failed - AST in normal range and within 360 days    AST  Date Value Ref Range Status  10/11/2019 20 10 - 40 U/L Final         Failed - ALT in normal range and within 360 days    ALT  Date Value Ref Range Status  10/11/2019 26 9 - 46 U/L Final         Failed - Valid encounter within last 12 months    Recent Outpatient Visits           1 year ago Erectile dysfunction, unspecified erectile dysfunction type   [facility] [place], Netta Neat, DO   2 years ago Low testosterone in male   [facility], Netta Neat, DO   2 years ago Annual physical exam   [facility] Smitty Cords, DO   2 years ago Chronic chest pain   [facility] [place], Netta Neat, DO   2 years ago Centrilobular emphysema [facility])   [facility] Althea Charon, Netta Neat, DO       Future Appointments             Today Smitty Cords, DO [facility] [place]             Passed - Last BP in normal range    BP Readings from Last 1 Encounters:  03/03/20 131/79

## 2022-04-07 IMAGING — CR DG CHEST 2V
2 series · 2 of 2 positions shown · non-contrast
Comparison: July 21, 2019

CLINICAL DATA: Left-sided chest pain.

EXAM:
CHEST - 2 VIEW

[chest pa]
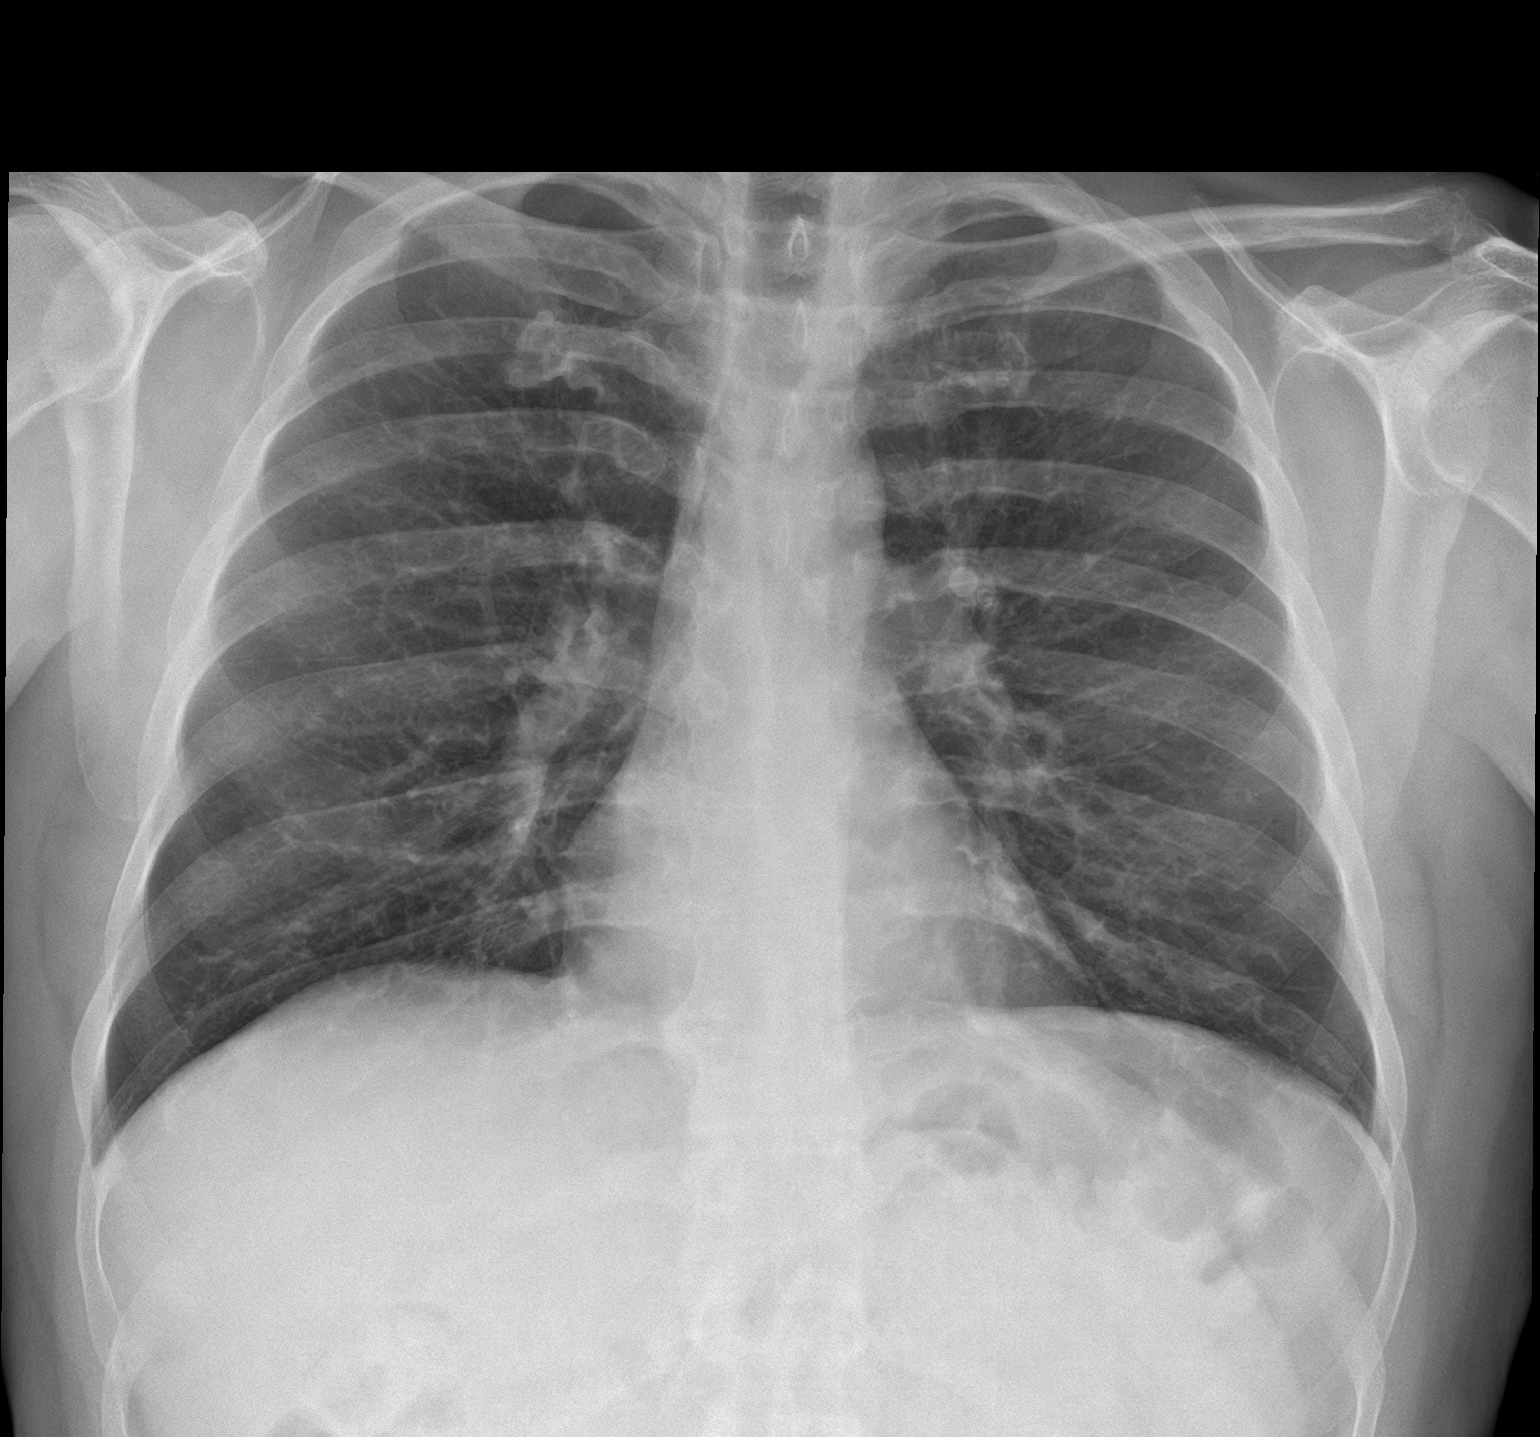

[chest lat]
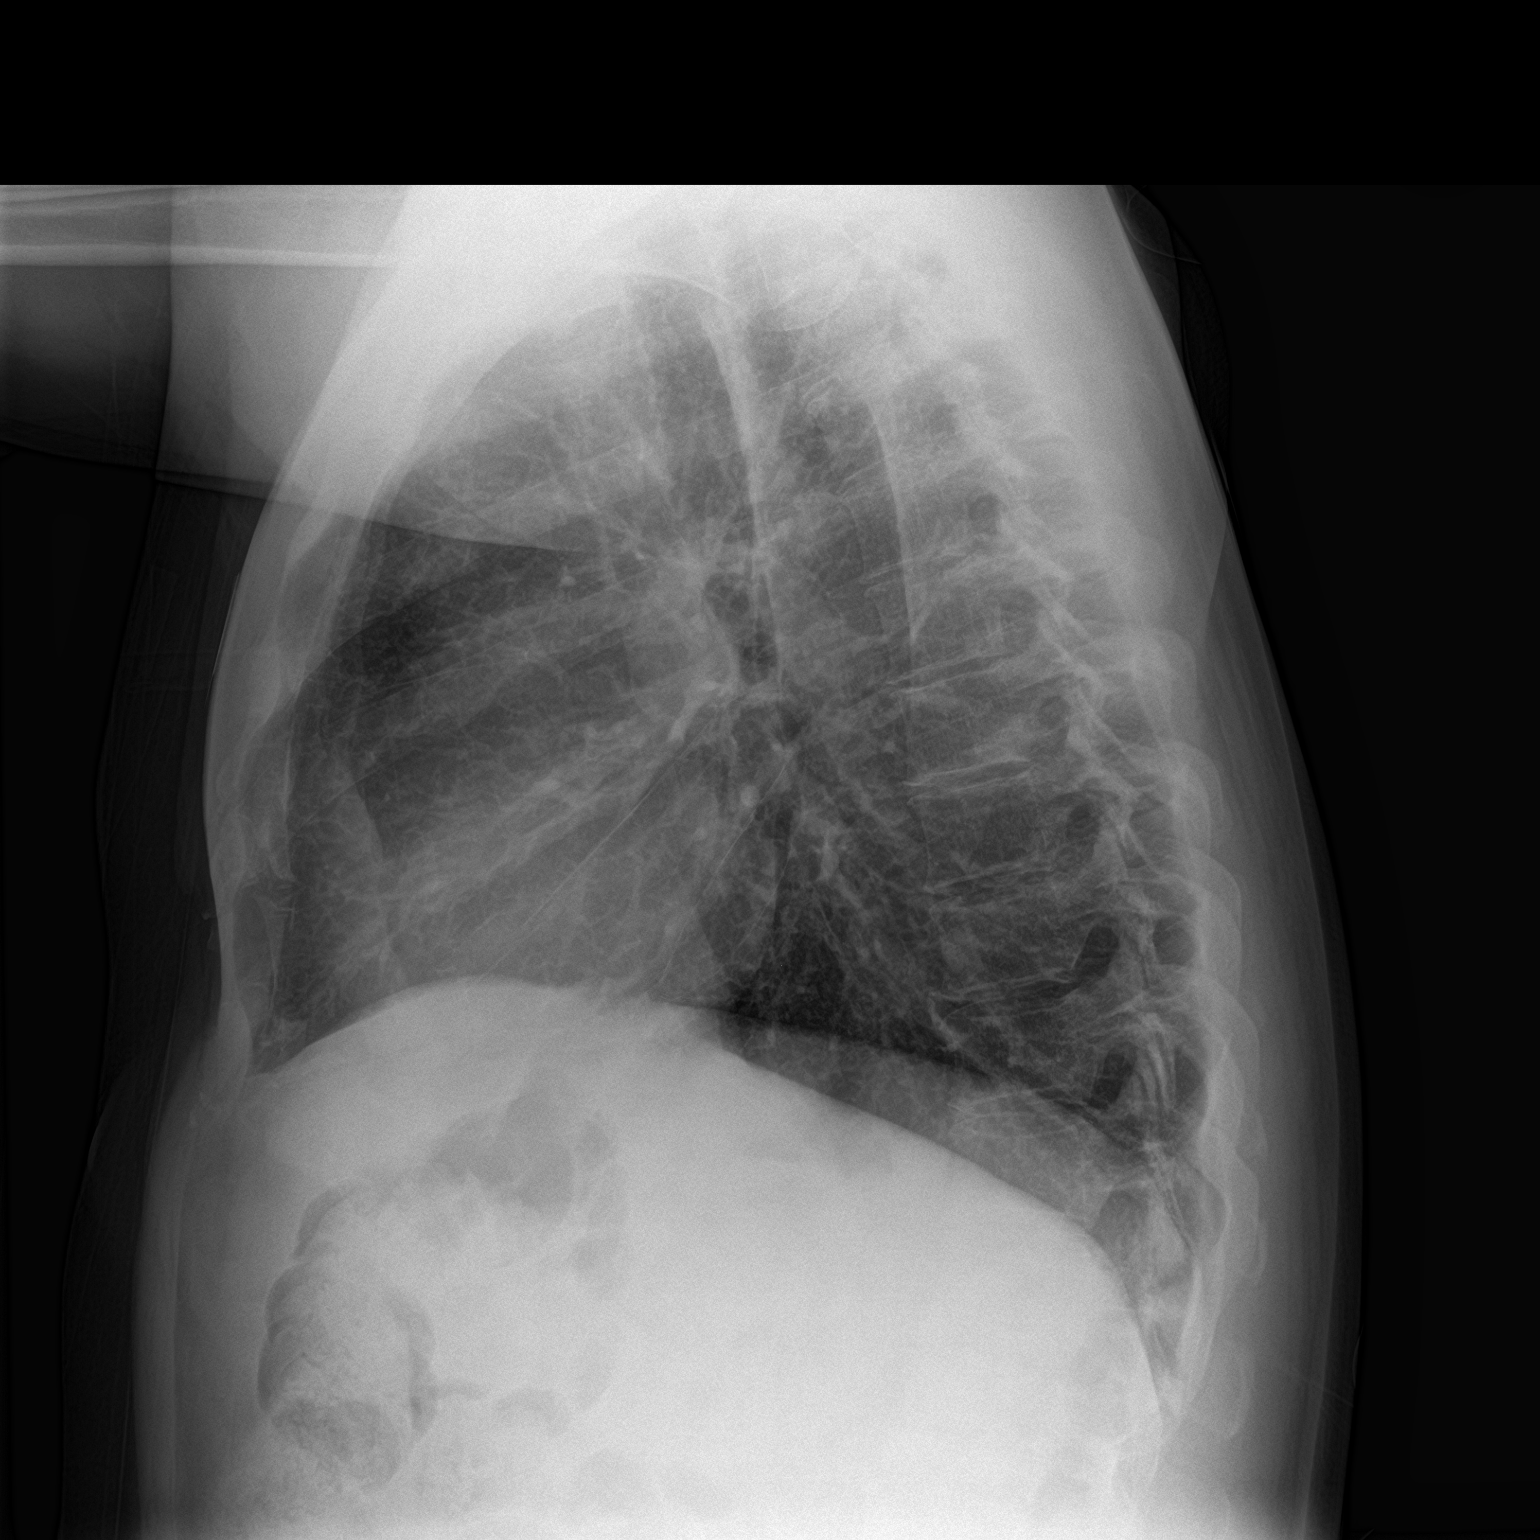

[2 of 2 positions shown; findings below may reference images not displayed]

FINDINGS: Cardiomediastinal silhouette is normal. Mediastinal contours appear
intact.

There is no evidence of focal airspace consolidation, pleural
effusion or pneumothorax.

Osseous structures are without acute abnormality. Soft tissues are
grossly normal.
IMPRESSION: No active cardiopulmonary disease.

## 2022-04-09 ENCOUNTER — Other Ambulatory Visit: Payer: Self-pay | Admitting: Family Medicine

## 2022-04-09 DIAGNOSIS — F411 Generalized anxiety disorder: Secondary | ICD-10-CM

## 2022-04-09 NOTE — Telephone Encounter (Signed)
Requested Prescriptions  Pending Prescriptions Disp Refills  . busPIRone (BUSPAR) 5 MG tablet [Pharmacy Med Name: BUSPIRONE HCL 5 MG TABLET] 90 tablet 1    Sig: TAKE 1 TABLET (5 MG TOTAL) BY MOUTH 3 (THREE) TIMES DAILY AS NEEDED (ANXIETY).     Psychiatry: Anxiolytics/Hypnotics - Non-controlled Passed - 04/09/2022  1:55 AM      Passed - Valid encounter within last 12 months    Recent Outpatient Visits          3 months ago GAD (generalized anxiety disorder)   [facility] Smitty Cords, DO   1 year ago Erectile dysfunction, unspecified erectile dysfunction type   [facility] [place], Netta Neat, DO   2 years ago Low testosterone in male   [facility] [place], Netta Neat, DO   2 years ago Annual physical exam   [facility] Smitty Cords, DO   2 years ago Chronic chest pain   [facility] Smitty Cords, DO      Future Appointments            In 6 days Althea Charon, Netta Neat, DO [facility]

## 2022-04-15 ENCOUNTER — Ambulatory Visit (INDEPENDENT_AMBULATORY_CARE_PROVIDER_SITE_OTHER): Payer: BC Managed Care – PPO | Admitting: Family Medicine

## 2022-04-15 ENCOUNTER — Encounter: Payer: Self-pay | Admitting: Family Medicine

## 2022-04-15 VITALS — BP 134/82 | HR 78 | Ht 70.0 in | Wt 170.0 lb

## 2022-04-15 DIAGNOSIS — Z0000 Encounter for general adult medical examination without abnormal findings: Secondary | ICD-10-CM | POA: Diagnosis not present

## 2022-04-15 DIAGNOSIS — Z114 Encounter for screening for human immunodeficiency virus [HIV]: Secondary | ICD-10-CM | POA: Diagnosis not present

## 2022-04-15 DIAGNOSIS — Z720 Tobacco use: Secondary | ICD-10-CM

## 2022-04-15 DIAGNOSIS — E781 Pure hyperglyceridemia: Secondary | ICD-10-CM

## 2022-04-15 DIAGNOSIS — Z125 Encounter for screening for malignant neoplasm of prostate: Secondary | ICD-10-CM | POA: Diagnosis not present

## 2022-04-15 DIAGNOSIS — F17210 Nicotine dependence, cigarettes, uncomplicated: Secondary | ICD-10-CM | POA: Diagnosis not present

## 2022-04-15 DIAGNOSIS — Z1159 Encounter for screening for other viral diseases: Secondary | ICD-10-CM | POA: Diagnosis not present

## 2022-04-15 DIAGNOSIS — B351 Tinea unguium: Secondary | ICD-10-CM

## 2022-04-15 MED ORDER — TERBINAFINE HCL 250 MG PO TABS: 250.0000 mg | ORAL_TABLET | Freq: Every day | ORAL | 2 refills | Status: DC

## 2022-04-15 MED ORDER — VARENICLINE TARTRATE 0.5 MG X 11 & 1 MG X 42 PO TBPK: ORAL_TABLET | ORAL | 0 refills | Status: DC

## 2022-04-15 NOTE — Patient Instructions (Addendum)
Thank you for coming to the office today.  Labs today  Toenail fungus medication ordered, 30 day and refill x 2 for total 3 months.  Try to reduce to smoking 1 ppd first before you start, or reduce quickly as soon as you start, quit completely day 8-14 pick your day, if still have to smoke, then can target Day 30.   Varenicline - Chantix Dosing: Duration Dosage  Initial 3 days  0.5mg  daily  Days 4-7 0.5mg  twice daily  Day 8 through the end of therapy  1mg  twice daily   Prescribing Instructions: Use of "Chantix - Starting Month Pak" and "Chantix - Continuing Month Pak" provide easy to use blister packaging.  Black Box Warning: Mood Change -"Serious neuropsychiatric events have been reported." Stop Drug and contact health care provider if patient. "agitation, hostility, depressed mood or changes in thinking or behavior that are not typical for the patient are observed, or if the patient develops suicidal ideation / behavior." Document potential mood change.  Patients can continue to smoke while initiating varenicline. A "target quit date" should be set on calendar approximately Day 8 to max Day 30 (of the treatment) - PREFERRED QUIT DATE - 1 to 2 weeks from start of medicine. Advise to take this medication WITH food.  Minimizing the nausea (typically mild and transient).  If nausea occurs, consider dose reduction or temporary cessation of treatment.   The treatment should be continued for 12 weeks.  An additional 12 weeks of therapy can be used at the prescribers discretion.   Chantix can change the way people react to alcohol (decreased tolerance, increased drunkenness, unusual or aggressive behavior, memory loss) Seizures occurred in patients that had no history of seizures and in patients that had a seizure disorder that had been well controlled  Discuss Cardiovascular Safety   Please schedule a Follow-up Appointment to: Return in about 1 year (around 04/16/2023) for 1 year Annual  Physical AM apt fasting lab after.  If you have any other questions or concerns, please feel free to call the office or send a message through MyChart. You may also schedule an earlier appointment if necessary.  Additionally, you may be receiving a survey about your experience at our office within a few days to 1 week by e-mail or mail. We value your feedback.  04/18/2023, DO [facility], [facility]

## 2022-04-15 NOTE — Progress Notes (Unsigned)
Subjective:    Patient ID: Lucas Peterson, male    DOB: 02-Jan-1976, 46 y.o.   MRN: [id number]  Lucas Peterson is a 46 y.o. male presenting on 04/15/2022 for Annual Exam   HPI  Here for Annual Physical and Lab Review  HYPERLIPIDEMIA / HyperTG - Reports no concerns. Last lipid panel 2021, unsure if fasting, had HyperTG >300 Lifestyle - Diet: Goal to reduce sodas, BC powder - Exercise: physically very active with exercise at work and yardwork  Tobacco Abuse Nicotine Dependence Has been years since last quit Smokes 1.5ppd Interested in medication  Onychomycosis, great toenails ***  Anxiety Off Lexapro He takes Buspar 5mg  PRN if needed some relief.  Health Maintenance: Due for Hep C and HIV Screening Due for PSA screening  Declines Flu Vaccine  Future Colon CA Screening age 17+ next year     04/15/2022    8:32 AM 01/07/2022    1:43 PM 10/19/2019    8:11 AM  Depression screen PHQ 2/9  Decreased Interest 0 0 0  Down, Depressed, Hopeless 0 1 0  PHQ - 2 Score 0 1 0  Altered sleeping 2 2   Tired, decreased energy 2 1   Change in appetite 1 0   Feeling bad or failure about yourself  0 0   Trouble concentrating 0 0   Moving slowly or fidgety/restless 2 0   Suicidal thoughts 0 0   PHQ-9 Score 7 4   Difficult doing work/chores Not difficult at all Not difficult at all     Past Medical History:  Diagnosis Date   Anxiety    History reviewed. No pertinent surgical history. Social History   Socioeconomic History   Marital status: Single    Spouse name: Not on file   Number of children: Not on file   Years of education: High School   Highest education level: High school graduate  Occupational History   Not on file  Tobacco Use   Smoking status: Every Day    Packs/day: 1.50    Years: 20.00    Total pack years: 30.00    Types: Cigarettes   Smokeless tobacco: Never   Tobacco comments:    previously 0.5 to 1ppd, now 10 years 1.5 year  Vaping Use    Vaping Use: Never used  Substance and Sexual Activity   Alcohol use: Never   Drug use: Never   Sexual activity: Not on file  Other Topics Concern   Not on file  Social History Narrative   Not on file   Social Determinants of Health   Financial Resource Strain: Not on file  Food Insecurity: Not on file  Transportation Needs: Not on file  Physical Activity: Not on file  Stress: Not on file  Social Connections: Not on file  Intimate Partner Violence: Not on file   Family History  Problem Relation Age of Onset   Stroke Mother 54   Diabetes Mother    Stroke Father 39   Thyroid disease Sister    Diabetes Brother    Current Outpatient Medications on File Prior to Visit  Medication Sig   Aspirin-Salicylamide-Caffeine (BC FAST PAIN RELIEF) [phone] MG PACK Take 1 packet by mouth 2 (two) times daily.   busPIRone (BUSPAR) 5 MG tablet TAKE 1 TABLET (5 MG TOTAL) BY MOUTH 3 (THREE) TIMES DAILY AS NEEDED (ANXIETY).   cyclobenzaprine (FLEXERIL) 10 MG tablet Take 1 tablet by mouth 2 (two) times daily.   methocarbamol (ROBAXIN) 750 MG  tablet Take by mouth.   sildenafil (REVATIO) 20 MG tablet Take 2-5 pills about 30 min prior to sex   No current facility-administered medications on file prior to visit.    Review of Systems Per HPI unless specifically indicated above      Objective:    BP 134/82   Pulse 78   Ht 5\' 10"  (1.778 m)   Wt 170 lb (77.1 kg)   SpO2 99%   BMI 24.39 kg/m   Wt Readings from Last 3 Encounters:  04/15/22 170 lb (77.1 kg)  01/07/22 170 lb 3.2 oz (77.2 kg)  10/09/20 175 lb (79.4 kg)    Physical Exam   Results for orders placed or performed during the hospital encounter of 03/04/20  Basic metabolic panel  Result Value Ref Range   Sodium 138 135 - 145 mmol/L   Potassium 3.7 3.5 - 5.1 mmol/L   Chloride 104 98 - 111 mmol/L   CO2 24 22 - 32 mmol/L   Glucose, Bld 104 (H) 70 - 99 mg/dL   BUN 16 6 - 20 mg/dL   Creatinine, Ser 03/06/20 0.61 - 1.24 mg/dL    Calcium 9.6 8.9 - 1.66 mg/dL   GFR calc non Af Amer >60 >60 mL/min   GFR calc Af Amer >60 >60 mL/min   Anion gap 10 5 - 15  CBC  Result Value Ref Range   WBC 9.6 4.0 - 10.5 K/uL   RBC 5.32 4.22 - 5.81 MIL/uL   Hemoglobin 16.6 13.0 - 17.0 g/dL   HCT 06.3 01.6 - 01.0 %   MCV 88.7 80.0 - 100.0 fL   MCH 31.2 26.0 - 34.0 pg   MCHC 35.2 30.0 - 36.0 g/dL   RDW 93.2 35.5 - 73.2 %   Platelets 197 150 - 400 K/uL   nRBC 0.0 0.0 - 0.2 %  Troponin I (High Sensitivity)  Result Value Ref Range   Troponin I (High Sensitivity) 3 <18 ng/L      Assessment & Plan:   Problem List Items Addressed This Visit     Tobacco abuse   Relevant Medications   varenicline (CHANTIX PAK) 0.5 MG X 11 & 1 MG X 42 tablet   Other Visit Diagnoses     Annual physical exam    -  Primary   Relevant Orders   COMPLETE METABOLIC PANEL WITH GFR   CBC with Differential/Platelet   Lipid panel   Hemoglobin A1c   Screening for prostate cancer       Relevant Orders   PSA   Need for hepatitis C screening test       Relevant Orders   Hepatitis C antibody   Screening for HIV (human immunodeficiency virus)       Relevant Medications   terbinafine (LAMISIL) 250 MG tablet   Other Relevant Orders   HIV Antibody (routine testing w rflx)   Hypertriglyceridemia       Relevant Orders   COMPLETE METABOLIC PANEL WITH GFR   Lipid panel   TSH   Cigarette nicotine dependence without complication       Relevant Medications   varenicline (CHANTIX PAK) 0.5 MG X 11 & 1 MG X 42 tablet   Onychomycosis       Relevant Medications   terbinafine (LAMISIL) 250 MG tablet       Updated Health Maintenance information Non fasting labs due today Encouraged improvement to lifestyle with diet and exercise Goal of weight loss   Meds ordered this  encounter  Medications   varenicline (CHANTIX PAK) 0.5 MG X 11 & 1 MG X 42 tablet    Sig: Take one 0.5 mg tab by mouth once daily for 3 days, increase to one 0.5 mg twice daily for 4 days,  then increase to one 1 mg twice daily.    Dispense:  53 tablet    Refill:  0   terbinafine (LAMISIL) 250 MG tablet    Sig: Take 1 tablet (250 mg total) by mouth daily.    Dispense:  30 tablet    Refill:  2      Follow up plan: Return in about 1 year (around 04/16/2023) for 1 year Annual Physical AM apt fasting lab after.  Saralyn Pilar, DO [facility] 04/15/2022, 8:54 AM

## 2022-07-26 ENCOUNTER — Telehealth: Payer: 59 | Admitting: Physician Assistant

## 2022-07-26 DIAGNOSIS — J020 Streptococcal pharyngitis: Secondary | ICD-10-CM

## 2022-07-26 MED ORDER — AMOXICILLIN 500 MG PO CAPS: 500.0000 mg | ORAL_CAPSULE | Freq: Two times a day (BID) | ORAL | 0 refills | Status: AC

## 2022-07-26 NOTE — Progress Notes (Signed)

## 2022-08-06 ENCOUNTER — Encounter: Payer: Self-pay | Admitting: Family Medicine

## 2022-08-06 DIAGNOSIS — J432 Centrilobular emphysema: Secondary | ICD-10-CM

## 2022-08-06 MED ORDER — VENTOLIN HFA 108 (90 BASE) MCG/ACT IN AERS: 2.0000 | INHALATION_SPRAY | Freq: Four times a day (QID) | RESPIRATORY_TRACT | 2 refills | Status: DC | PRN

## 2022-09-05 ENCOUNTER — Telehealth: Payer: BC Managed Care – PPO | Admitting: Nurse Practitioner

## 2022-09-05 DIAGNOSIS — J029 Acute pharyngitis, unspecified: Secondary | ICD-10-CM | POA: Diagnosis not present

## 2022-09-05 NOTE — Progress Notes (Signed)
I have spent 5 minutes in review of e-visit questionnaire, review and updating patient chart, medical decision making and response to patient.  ° °Evin Chirco W Hisayo Delossantos, NP ° °  °

## 2022-09-05 NOTE — Progress Notes (Signed)
E-Visit for Sore Throat At this time we recommend reaching back out to us if your symptoms do not improve in 4-5 days.  Please keep in mind white patches in the back of the throat and fever can also be associated with viral pharyngitis.  We are sorry that you are not feeling well.  Here is how we plan to help!  Your symptoms indicate a likely viral infection (Pharyngitis).   Pharyngitis is inflammation in the back of the throat which can cause a sore throat, scratchiness and sometimes difficulty swallowing.   Pharyngitis is typically caused by a respiratory virus and will just run its course.  Please keep in mind that your symptoms could last up to 10 days.  For throat pain, we recommend over the counter oral pain relief medications such as acetaminophen or aspirin, or anti-inflammatory medications such as ibuprofen or naproxen sodium.  Topical treatments such as oral throat lozenges or sprays may be used as needed.  Avoid close contact with loved ones, especially the very young and elderly.  Remember to wash your hands thoroughly throughout the day as this is the number one way to prevent the spread of infection and wipe down door knobs and counters with disinfectant.  After careful review of your answers, I would not recommend an antibiotic for your condition.  Antibiotics should not be used to treat conditions that we suspect are caused by viruses like the virus that causes the common cold or flu. However, some people can have Strep with atypical symptoms. You may need formal testing in clinic or office to confirm if your symptoms continue or worsen.  Providers prescribe antibiotics to treat infections caused by bacteria. Antibiotics are very powerful in treating bacterial infections when they are used properly.  To maintain their effectiveness, they should be used only when necessary.  Overuse of antibiotics has resulted in the development of super bugs that are resistant to treatment!    Home  Care: Only take medications as instructed by your medical team. Do not drink alcohol while taking these medications. A steam or ultrasonic humidifier can help congestion.  You can place a towel over your head and breathe in the steam from hot water coming from a faucet. Avoid close contacts especially the very young and the elderly. Cover your mouth when you cough or sneeze. Always remember to wash your hands.  Get Help Right Away If: You develop worsening fever or throat pain. You develop a severe head ache or visual changes. Your symptoms persist after you have completed your treatment plan.  Make sure you Understand these instructions. Will watch your condition. Will get help right away if you are not doing well or get worse.   Thank you for choosing an e-visit.  Your e-visit answers were reviewed by a board certified advanced clinical practitioner to complete your personal care plan. Depending upon the condition, your plan could have included both over the counter or prescription medications.  Please review your pharmacy choice. Make sure the pharmacy is open so you can pick up prescription now. If there is a problem, you may contact your provider through MyChart messaging and have the prescription routed to another pharmacy.  Your safety is important to us. If you have drug allergies check your prescription carefully.   For the next 24 hours you can use MyChart to ask questions about today's visit, request a non-urgent call back, or ask for a work or school excuse. You will get an email in the   next two days asking about your experience. I hope that your e-visit has been valuable and will speed your recovery.  

## 2022-10-30 DIAGNOSIS — J209 Acute bronchitis, unspecified: Secondary | ICD-10-CM | POA: Diagnosis not present

## 2022-12-30 ENCOUNTER — Other Ambulatory Visit: Payer: Self-pay | Admitting: Family Medicine

## 2022-12-30 DIAGNOSIS — N529 Male erectile dysfunction, unspecified: Secondary | ICD-10-CM

## 2022-12-30 NOTE — Telephone Encounter (Signed)
Requested medication (s) are due for refill today: yes  Requested medication (s) are on the active medication list: yes    Last refill: 01/07/22  #90  4 refills  Future visit scheduled no  Notes to clinic:Failed due to labs, please review. Thank you.  Requested Prescriptions  Pending Prescriptions Disp Refills   sildenafil (REVATIO) 20 MG tablet [Pharmacy Med Name: Sildenafil Citrate 20 MG Oral Tablet] 90 tablet 0    Sig: TAKE 2 TO 5 TABLETS BY MOUTH ABOUT 30 MINUTES PRIOR TO SEX AS NEEDED     Urology: Erectile Dysfunction Agents Failed - 12/30/2022  5:51 PM      Failed - AST in normal range and within 360 days    AST  Date Value Ref Range Status  10/11/2019 20 10 - 40 U/L Final         Failed - ALT in normal range and within 360 days    ALT  Date Value Ref Range Status  10/11/2019 26 9 - 46 U/L Final         Passed - Last BP in normal range    BP Readings from Last 1 Encounters:  04/15/22 134/82         Passed - Valid encounter within last 12 months    Recent Outpatient Visits           8 months ago Annual physical exam   [place] [facility] Smitty Cords, DO   11 months ago GAD (generalized anxiety disorder)   [place] [facility] Smitty Cords, DO   2 years ago Erectile dysfunction, unspecified erectile dysfunction type   [place] [facility] Smitty Cords, DO   3 years ago Low testosterone in male   [place] [facility] [place], Netta Neat, DO   3 years ago Annual physical exam   [place] [facility] [place], Netta Neat, [place]

## 2023-02-19 ENCOUNTER — Other Ambulatory Visit: Payer: Self-pay | Admitting: Family Medicine

## 2023-02-19 DIAGNOSIS — N529 Male erectile dysfunction, unspecified: Secondary | ICD-10-CM

## 2023-02-19 NOTE — Telephone Encounter (Signed)
Requested medication (s) are due for refill today: yes  Requested medication (s) are on the active medication list: yes  Last refill:  12/31/22 #90/0  Future visit scheduled: no  Notes to clinic:  Unable to refill per protocol due to failed labs, no updated results.      Requested Prescriptions  Pending Prescriptions Disp Refills   sildenafil (REVATIO) 20 MG tablet [Pharmacy Med Name: Sildenafil Citrate 20 MG Oral Tablet] 90 tablet 0    Sig: TAKE TWO TO FIVE TABLETS BY MOUTH ABOUT 30 MINUTES PRIOR TO SEX AS NEEDED     Urology: Erectile Dysfunction Agents Failed - 02/19/2023  5:34 PM      Failed - AST in normal range and within 360 days    AST  Date Value Ref Range Status  10/11/2019 20 10 - 40 U/L Final         Failed - ALT in normal range and within 360 days    ALT  Date Value Ref Range Status  10/11/2019 26 9 - 46 U/L Final         Passed - Last BP in normal range    BP Readings from Last 1 Encounters:  04/15/22 134/82         Passed - Valid encounter within last 12 months    Recent Outpatient Visits           10 months ago Annual physical exam   [place] [facility] Smitty Cords, DO   1 year ago GAD (generalized anxiety disorder)   [place] [facility] Smitty Cords, DO   2 years ago Erectile dysfunction, unspecified erectile dysfunction type   [place] Smitty Cords, DO   3 years ago Low testosterone in male   [place] [facility] [place], Netta Neat, DO   3 years ago Annual physical exam   [place] [facility] [place], Netta Neat, [place]

## 2023-02-20 ENCOUNTER — Other Ambulatory Visit: Payer: Self-pay | Admitting: Family Medicine

## 2023-02-20 DIAGNOSIS — J432 Centrilobular emphysema: Secondary | ICD-10-CM

## 2023-02-22 NOTE — Telephone Encounter (Signed)
Patient will need an office visit for further refills. Requested Prescriptions  Pending Prescriptions Disp Refills   VENTOLIN HFA 108 (90 Base) MCG/ACT inhaler [Pharmacy Med Name: VENTOLIN HFA 90 MCG INHALER] 18 each 0    Sig: TAKE 2 PUFFS BY MOUTH EVERY 6 HOURS AS NEEDED FOR WHEEZE OR SHORTNESS OF BREATH     Pulmonology:  Beta Agonists 2 Passed - 02/20/2023  5:47 AM      Passed - Last BP in normal range    BP Readings from Last 1 Encounters:  04/15/22 134/82         Passed - Last Heart Rate in normal range    Pulse Readings from Last 1 Encounters:  04/15/22 78         Passed - Valid encounter within last 12 months    Recent Outpatient Visits           10 months ago Annual physical exam   [place] [facility] [place], Netta Neat, DO   1 year ago GAD (generalized anxiety disorder)   [place] [facility] Smitty Cords, DO   2 years ago Erectile dysfunction, unspecified erectile dysfunction type   [place] [facility] [facility] Smitty Cords, DO   3 years ago Low testosterone in male   [place] [facility] [place], Netta Neat, DO   3 years ago Annual physical exam   [place] [facility] [place], Netta Neat, [place]

## 2023-03-08 ENCOUNTER — Encounter: Payer: Self-pay | Admitting: Family Medicine

## 2023-03-08 ENCOUNTER — Other Ambulatory Visit: Payer: Self-pay | Admitting: Family Medicine

## 2023-03-08 DIAGNOSIS — N529 Male erectile dysfunction, unspecified: Secondary | ICD-10-CM | POA: Diagnosis not present

## 2023-03-08 DIAGNOSIS — Z760 Encounter for issue of repeat prescription: Secondary | ICD-10-CM | POA: Diagnosis not present

## 2023-03-08 DIAGNOSIS — Z8709 Personal history of other diseases of the respiratory system: Secondary | ICD-10-CM | POA: Diagnosis not present

## 2023-03-08 DIAGNOSIS — J432 Centrilobular emphysema: Secondary | ICD-10-CM

## 2023-03-08 NOTE — Telephone Encounter (Signed)
Medication Refill - Medication:  sildenafil (REVATIO) 20 MG tablet   VENTOLIN HFA 108 (90 Base) MCG/ACT inhaler (maybe one month left but will need prior to physical appt.)   Has the patient contacted their pharmacy? Yes.  Advised to contact PCP   Preferred Pharmacy (with phone number or street name):   *INHALER ONLY TO BE SENT HERE:  [id number] - [place], [place] - [address]  Phone: [phone] Fax: [phone]    *sildenafil ONLY to be sent here:  [facility] [address] [place] - [address]  Phone: [phone] Fax: [phone]     Has the patient been seen for an appointment in the last year OR does the patient have an upcoming appointment? YES. Annual Physical scheduled for 9.30.2024.   Patients callback # [phone]

## 2023-03-08 NOTE — Telephone Encounter (Signed)
sildenafil ONLY to be sent here:   [facility] [address] [place] - [address]

## 2023-03-08 NOTE — Telephone Encounter (Signed)
*  INHALER ONLY TO BE SENT HERE:   [id number] - [place], [place] - [address]   Phone:[phone]

## 2023-03-09 MED ORDER — SILDENAFIL CITRATE 20 MG PO TABS
ORAL_TABLET | ORAL | 0 refills | Status: DC
Start: 2023-03-09 — End: 2023-03-15

## 2023-03-09 MED ORDER — SILDENAFIL CITRATE 20 MG PO TABS: ORAL_TABLET | ORAL | 0 refills | Status: DC

## 2023-03-09 NOTE — Telephone Encounter (Signed)
Requested Prescriptions  Pending Prescriptions Disp Refills   sildenafil (REVATIO) 20 MG tablet 90 tablet 0    Sig: TAKE 2 TO 5 TABLETS BY MOUTH ABOUT 30 MINUTES PRIOR TO SEX AS NEEDED     Urology: Erectile Dysfunction Agents Failed - 03/08/2023  9:20 AM      Failed - AST in normal range and within 360 days    AST  Date Value Ref Range Status  10/11/2019 20 10 - 40 U/L Final         Failed - ALT in normal range and within 360 days    ALT  Date Value Ref Range Status  10/11/2019 26 9 - 46 U/L Final         Passed - Last BP in normal range    BP Readings from Last 1 Encounters:  04/15/22 134/82         Passed - Valid encounter within last 12 months    Recent Outpatient Visits           10 months ago Annual physical exam   [place] [facility] Smitty Cords, DO   1 year ago GAD (generalized anxiety disorder)   [place] [facility] Smitty Cords, DO   2 years ago Erectile dysfunction, unspecified erectile dysfunction type   [place] [facility] Smitty Cords, DO   3 years ago Low testosterone in male   [facility] [facility] [place], Netta Neat, DO   3 years ago Annual physical exam   [place] [facility] Althea Charon, Netta Neat, DO       Future Appointments             In 3 days Althea Charon, Netta Neat, DO [place] [facility]   In 2 months Althea Charon, Netta Neat, DO [facility]

## 2023-03-12 ENCOUNTER — Telehealth: Payer: BC Managed Care – PPO | Admitting: Family Medicine

## 2023-03-15 ENCOUNTER — Ambulatory Visit: Payer: BC Managed Care – PPO | Admitting: Family Medicine

## 2023-03-15 ENCOUNTER — Encounter: Payer: Self-pay | Admitting: Family Medicine

## 2023-03-15 ENCOUNTER — Other Ambulatory Visit: Payer: Self-pay | Admitting: Family Medicine

## 2023-03-15 VITALS — BP 138/80 | HR 69 | Resp 15 | Ht 70.0 in | Wt 158.6 lb

## 2023-03-15 DIAGNOSIS — Z720 Tobacco use: Secondary | ICD-10-CM | POA: Diagnosis not present

## 2023-03-15 DIAGNOSIS — Z0000 Encounter for general adult medical examination without abnormal findings: Secondary | ICD-10-CM

## 2023-03-15 DIAGNOSIS — E781 Pure hyperglyceridemia: Secondary | ICD-10-CM

## 2023-03-15 DIAGNOSIS — J432 Centrilobular emphysema: Secondary | ICD-10-CM | POA: Diagnosis not present

## 2023-03-15 DIAGNOSIS — N529 Male erectile dysfunction, unspecified: Secondary | ICD-10-CM | POA: Diagnosis not present

## 2023-03-15 DIAGNOSIS — Z125 Encounter for screening for malignant neoplasm of prostate: Secondary | ICD-10-CM

## 2023-03-15 DIAGNOSIS — R7309 Other abnormal glucose: Secondary | ICD-10-CM

## 2023-03-15 MED ORDER — BREZTRI AEROSPHERE 160-9-4.8 MCG/ACT IN AERO
2.0000 | INHALATION_SPRAY | Freq: Two times a day (BID) | RESPIRATORY_TRACT | 11 refills | Status: DC
Start: 2023-03-15 — End: 2024-04-07

## 2023-03-15 MED ORDER — TADALAFIL 20 MG PO TABS
20.0000 mg | ORAL_TABLET | ORAL | 3 refills | Status: DC | PRN
Start: 2023-03-15 — End: 2024-04-20

## 2023-03-15 NOTE — Progress Notes (Signed)
Subjective:    Patient ID: Lucas Peterson, male    DOB: October 09, 1975, 47 y.o.   MRN: [id number]  Lucas Peterson is a 47 y.o. male presenting on 03/15/2023 for Erectile Dysfunction (Medications ) and Bump (Right shoulder for 1 year)   HPI  HYPERLIPIDEMIA / HyperTG - Reports no concerns. Last lipid panel 2021, unsure if fasting, had HyperTG >300 Lifestyle Dietary goals - Exercise: physically very active with exercise at work and yardwork   Tobacco Abuse Nicotine Dependence Has been years since last quit Smokes 1.5ppd   Anxiety Off Lexapro Off Buspar  Centrilobular Emphysema Active smoker, Tobacco Abuse He was on Anoro previously Now has episodic flares congestion / bronchitis On Albuterol AS NEEDED Requesting daily inhaler  Erectile Dysfunction Sildenafil not as effective. Interested in longer duration med like Cialis, request rx  Knot on R Shoulder 1 cm firm nodular density associated with shoulder, asymptomatic      03/15/2023    8:58 AM 04/15/2022    8:32 AM 01/07/2022    1:43 PM  Depression screen PHQ 2/9  Decreased Interest 0 0 0  Down, Depressed, Hopeless 0 0 1  PHQ - 2 Score 0 0 1  Altered sleeping 2 2 2   Tired, decreased energy 1 2 1   Change in appetite 0 1 0  Feeling bad or failure about yourself  0 0 0  Trouble concentrating 0 0 0  Moving slowly or fidgety/restless 0 2 0  Suicidal thoughts 0 0 0  PHQ-9 Score 3 7 4   Difficult doing work/chores Not difficult at all Not difficult at all Not difficult at all    Social History   Tobacco Use   Smoking status: Every Day    Current packs/day: 1.50    Average packs/day: 1.5 packs/day for 20.0 years (30.0 ttl pk-yrs)    Types: Cigarettes   Smokeless tobacco: Never   Tobacco comments:    previously 0.5 to 1ppd, now 10 years 1.5 year  Vaping Use   Vaping status: Never Used  Substance Use Topics   Alcohol use: Never   Drug use: Never    Review of Systems Per HPI unless specifically indicated  above     Objective:    BP 138/80 (BP Location: Left Arm, Patient Position: Sitting, Cuff Size: Normal)   Pulse 69   Resp 15   Ht 5\' 10"  (1.778 m)   Wt 158 lb 9.6 oz (71.9 kg)   SpO2 98%   BMI 22.76 kg/m   Wt Readings from Last 3 Encounters:  03/15/23 158 lb 9.6 oz (71.9 kg)  04/15/22 170 lb (77.1 kg)  01/07/22 170 lb 3.2 oz (77.2 kg)    Physical Exam Vitals and nursing note reviewed.  Constitutional:      General: He is not in acute distress.    Appearance: Normal appearance. He is well-developed. He is not diaphoretic.     Comments: Well-appearing, comfortable, cooperative  HENT:     Head: Normocephalic and atraumatic.  Eyes:     General:        Right eye: No discharge.        Left eye: No discharge.     Conjunctiva/sclera: Conjunctivae normal.  Cardiovascular:     Rate and Rhythm: Normal rate.  Pulmonary:     Effort: Pulmonary effort is normal.  Lymphadenopathy:     Cervical: No cervical adenopathy.  Skin:    General: Skin is warm and dry.     Findings: Lesion (R  posterior shoulder 1 cm nodular firm density, feels mobile, not characteristic of lymph node, feels more like lipoma vs fibrous nodular density assoc with shoulder) present. No erythema or rash.  Neurological:     Mental Status: He is alert and oriented to person, place, and time.  Psychiatric:        Mood and Affect: Mood normal.        Behavior: Behavior normal.        Thought Content: Thought content normal.     Comments: Well groomed, good eye contact, normal speech and thoughts      Results for orders placed or performed during the hospital encounter of 03/04/20  Basic metabolic panel  Result Value Ref Range   Sodium 138 135 - 145 mmol/L   Potassium 3.7 3.5 - 5.1 mmol/L   Chloride 104 98 - 111 mmol/L   CO2 24 22 - 32 mmol/L   Glucose, Bld 104 (H) 70 - 99 mg/dL   BUN 16 6 - 20 mg/dL   Creatinine, Ser 1.19 0.61 - 1.24 mg/dL   Calcium 9.6 8.9 - 14.7 mg/dL   GFR calc non Af Amer >60 >60  mL/min   GFR calc Af Amer >60 >60 mL/min   Anion gap 10 5 - 15  CBC  Result Value Ref Range   WBC 9.6 4.0 - 10.5 K/uL   RBC 5.32 4.22 - 5.81 MIL/uL   Hemoglobin 16.6 13.0 - 17.0 g/dL   HCT 82.9 56.2 - 13.0 %   MCV 88.7 80.0 - 100.0 fL   MCH 31.2 26.0 - 34.0 pg   MCHC 35.2 30.0 - 36.0 g/dL   RDW 86.5 78.4 - 69.6 %   Platelets 197 150 - 400 K/uL   nRBC 0.0 0.0 - 0.2 %  Troponin I (High Sensitivity)  Result Value Ref Range   Troponin I (High Sensitivity) 3 <18 ng/L      Assessment & Plan:   Problem List Items Addressed This Visit     Centrilobular emphysema (HCC) - Primary   Relevant Medications   BREZTRI AEROSPHERE 160-9-4.8 MCG/ACT AERO   Tobacco abuse   Other Visit Diagnoses     Erectile dysfunction, unspecified erectile dysfunction type       Relevant Medications   tadalafil (CIALIS) 20 MG tablet       Centrilobular Emphysema Not controlled Discussed tobacco abuse Continue Albuterol Add Breztri 2 puff twice a day maintenance inhaler  ED Switch Sildenafil to Tadalafil - Cialis, may take up to every 48 hours if you prefer to keep it.   Meds ordered this encounter  Medications   BREZTRI AEROSPHERE 160-9-4.8 MCG/ACT AERO    Sig: Inhale 2 puffs into the lungs 2 (two) times daily.    Dispense:  10.7 g    Refill:  11   tadalafil (CIALIS) 20 MG tablet    Sig: Take 1 tablet (20 mg total) by mouth every other day as needed for erectile dysfunction.    Dispense:  90 tablet    Refill:  3    Follow up plan: Return in about 8 weeks (around 05/10/2023) for 8 weeks fasting lab only, already has Physical 9/30.  Future labs ordered for 04/2023   Saralyn Pilar, DO [facility] 03/15/2023, 9:06 AM

## 2023-03-15 NOTE — Patient Instructions (Addendum)
Thank you for coming to the office today.  Add Breztri 2 puff twice a day maintenance inhaler  Switch Sildenafil to Tadalafil - Cialis, may take up to every 48 hours if you prefer to keep it.  Albuterol as needed  DUE for FASTING BLOOD WORK (no food or drink after midnight before the lab appointment, only water or coffee without cream/sugar on the morning of)  SCHEDULE "Lab Only" visit in the morning at the clinic for lab draw in Sept 2024  - Make sure Lab Only appointment is at about 1 week before your next appointment, so that results will be available  For Lab Results, once available within 2-3 days of blood draw, you can can log in to MyChart online to view your results and a brief explanation. Also, we can discuss results at next follow-up visit.   Please schedule a Follow-up Appointment to: Return in about 8 weeks (around 05/10/2023) for 8 weeks fasting lab only, already has Physical 9/30.  If you have any other questions or concerns, please feel free to call the office or send a message through MyChart. You may also schedule an earlier appointment if necessary.  Additionally, you may be receiving a survey about your experience at our office within a few days to 1 week by e-mail or mail. We value your feedback.  Saralyn Pilar, DO [facility], [place]

## 2023-03-16 ENCOUNTER — Encounter: Payer: Self-pay | Admitting: Family Medicine

## 2023-03-16 DIAGNOSIS — J432 Centrilobular emphysema: Secondary | ICD-10-CM

## 2023-03-17 ENCOUNTER — Ambulatory Visit: Payer: BC Managed Care – PPO | Admitting: Pharmacist

## 2023-03-17 DIAGNOSIS — J432 Centrilobular emphysema: Secondary | ICD-10-CM

## 2023-03-17 NOTE — Patient Instructions (Signed)
Reminders for using your Breztri inhaler:  Please remember to prime your inhaler before using it for the first time. To do this, remove the cap from the mouthpiece, hold it upright, facing away from you, and shake well. Push the canister all the way down until it stops moving in the actuator to release a puff of medicine from the mouthpiece. You may hear a soft click from the dose indicator as it counts down. Repeat this process three times more.  To use the inhaler, take the cap off and shake the primed inhaler. Exhale as much as you reasonably can through your mouth, then close your lips around the mouthpiece and tilt your head back, keeping your tongue below the mouthpiece. Inhale deeply and slowly while pressing down on the canister until it stops moving. You should feel a puff of medicine. Remove the inhaler while holding your breath for as long as you comfortably can, up to 10 seconds, before breathing out gently. Repeat this process once more and replace the cap. You should be taking 2 puffs, 2 times a day. After your second puff, rinse your mouth with water to remove any excess medicine, making sure you don't swallow the water.  Please let me know if you have any further medication questions or concerns!  Estelle Grumbles, PharmD, [facility] Clinical Pharmacist [facility] [phone]

## 2023-03-17 NOTE — Progress Notes (Signed)
   03/17/2023  Patient ID: Lucas Peterson, male   DOB: [birth year], 46 y.o.   MRN: [id number]  Receive a message from PCP requesting outreach to patient related to medication assistance for the cost of Breztri inhaler  From review of chart, note patient contacted office regarding cost of Breztri as was advised by pharmacy that cost was over $1000.  Outreach to [employer] today on behalf of patient and help to resolve billing issue. Find that pharmacy was processing through patient's old insurance, rather than his current BCBS plan. Once processed through current plan, confirms patient's copayment is $35.  Outreach to patient to provide this update. Provide patient with counseling on Breztri inhaler technique and directions. Also advise patient of manufacturer savings card, which may further reduce patient's cost.   Plan:  Patient denies further medication questions or concerns today Provide patient with contact information for clinic pharmacist to contact if needed in future for medication questions/concerns  Estelle Grumbles, PharmD, [facility] [place], CPP Clinical Pharmacist [facility] [phone]

## 2023-03-21 ENCOUNTER — Other Ambulatory Visit: Payer: Self-pay | Admitting: Family Medicine

## 2023-03-21 DIAGNOSIS — J432 Centrilobular emphysema: Secondary | ICD-10-CM

## 2023-03-22 NOTE — Telephone Encounter (Signed)
Unable to refill per protocol, Rx request is too soon. Duplicate request.  Requested Prescriptions  Pending Prescriptions Disp Refills   VENTOLIN HFA 108 (90 Base) MCG/ACT inhaler [Pharmacy Med Name: VENTOLIN HFA 90 MCG INHALER] 18 each 0    Sig: TAKE 2 PUFFS BY MOUTH EVERY 6 HOURS AS NEEDED FOR WHEEZE OR SHORTNESS OF BREATH     Pulmonology:  Beta Agonists 2 Passed - 03/21/2023  1:13 AM      Passed - Last BP in normal range    BP Readings from Last 1 Encounters:  03/15/23 138/80         Passed - Last Heart Rate in normal range    Pulse Readings from Last 1 Encounters:  03/15/23 69         Passed - Valid encounter within last 12 months    Recent Outpatient Visits           5 days ago Centrilobular emphysema (HCC)   [facility] [place], Gentry Fitz A, RPH-CPP   1 week ago Centrilobular emphysema [facility])   [place] [facility] Smitty Cords, DO   11 months ago Annual physical exam   [facility] Smitty Cords, DO   1 year ago GAD (generalized anxiety disorder)   [facility] Smitty Cords, DO   2 years ago Erectile dysfunction, unspecified erectile dysfunction type   [place] [facility] [place], Netta Neat, DO       Future Appointments             In 1 month Althea Charon, Netta Neat, DO  [facility], [facility]

## 2023-04-05 ENCOUNTER — Ambulatory Visit: Payer: BC Managed Care – PPO | Admitting: Family Medicine

## 2023-05-10 ENCOUNTER — Other Ambulatory Visit: Payer: BC Managed Care – PPO

## 2023-05-10 DIAGNOSIS — J432 Centrilobular emphysema: Secondary | ICD-10-CM

## 2023-05-10 DIAGNOSIS — Z0000 Encounter for general adult medical examination without abnormal findings: Secondary | ICD-10-CM

## 2023-05-10 DIAGNOSIS — R7309 Other abnormal glucose: Secondary | ICD-10-CM

## 2023-05-10 DIAGNOSIS — E781 Pure hyperglyceridemia: Secondary | ICD-10-CM | POA: Diagnosis not present

## 2023-05-10 DIAGNOSIS — Z125 Encounter for screening for malignant neoplasm of prostate: Secondary | ICD-10-CM | POA: Diagnosis not present

## 2023-05-11 LAB — CBC WITH DIFFERENTIAL/PLATELET
Absolute Monocytes: 439 cells/uL (ref 200–950)
Basophils Absolute: 103 cells/uL (ref 0–200)
Basophils Relative: 1.2 %
Eosinophils Absolute: 120 cells/uL (ref 15–500)
Eosinophils Relative: 1.4 %
HCT: 49.6 % (ref 38.5–50.0)
Hemoglobin: 16.3 g/dL (ref 13.2–17.1)
Lymphs Abs: 1582 cells/uL (ref 850–3900)
MCH: 31.4 pg (ref 27.0–33.0)
MCHC: 32.9 g/dL (ref 32.0–36.0)
MCV: 95.6 fL (ref 80.0–100.0)
MPV: 11 fL (ref 7.5–12.5)
Monocytes Relative: 5.1 %
Neutro Abs: 6355 cells/uL (ref 1500–7800)
Neutrophils Relative %: 73.9 %
Platelets: 173 10*3/uL (ref 140–400)
RBC: 5.19 10*6/uL (ref 4.20–5.80)
RDW: 12 % (ref 11.0–15.0)
Total Lymphocyte: 18.4 %
WBC: 8.6 10*3/uL (ref 3.8–10.8)

## 2023-05-11 LAB — LIPID PANEL
Cholesterol: 133 mg/dL (ref <200)
HDL: 30 mg/dL — ABNORMAL LOW (ref >=40)
LDL Cholesterol (Calc): 80 mg/dL (calc)
Non-HDL Cholesterol (Calc): 103 mg/dL (calc) (ref <130)
Total CHOL/HDL Ratio: 4.4 (calc) (ref <5.0)
Triglycerides: 132 mg/dL (ref <150)

## 2023-05-11 LAB — COMPLETE METABOLIC PANEL WITH GFR
AG Ratio: 1.7 (calc) (ref 1.0–2.5)
ALT: 8 U/L — ABNORMAL LOW (ref 9–46)
AST: 12 U/L (ref 10–40)
Albumin: 4 g/dL (ref 3.6–5.1)
Alkaline phosphatase (APISO): 73 U/L (ref 36–130)
BUN: 12 mg/dL (ref 7–25)
CO2: 24 mmol/L (ref 20–32)
Calcium: 8.9 mg/dL (ref 8.6–10.3)
Chloride: 106 mmol/L (ref 98–110)
Creat: 0.71 mg/dL (ref 0.60–1.29)
Globulin: 2.4 g/dL (calc) (ref 1.9–3.7)
Glucose, Bld: 103 mg/dL — ABNORMAL HIGH (ref 65–99)
Potassium: 4.8 mmol/L (ref 3.5–5.3)
Sodium: 141 mmol/L (ref 135–146)
Total Bilirubin: 0.4 mg/dL (ref 0.2–1.2)
Total Protein: 6.4 g/dL (ref 6.1–8.1)
eGFR: 115 mL/min/{1.73_m2} (ref >=60)

## 2023-05-11 LAB — HEMOGLOBIN A1C
Hgb A1c MFr Bld: 4.8 % of total Hgb (ref <5.7)
Mean Plasma Glucose: 91 mg/dL
eAG (mmol/L): 5 mmol/L

## 2023-05-11 LAB — PSA: PSA: 0.36 ng/mL (ref <=4.00)

## 2023-05-11 LAB — TSH: TSH: 1.62 mIU/L (ref 0.40–4.50)

## 2023-05-17 ENCOUNTER — Ambulatory Visit (INDEPENDENT_AMBULATORY_CARE_PROVIDER_SITE_OTHER): Payer: BC Managed Care – PPO | Admitting: Family Medicine

## 2023-05-17 ENCOUNTER — Encounter: Payer: Self-pay | Admitting: Family Medicine

## 2023-05-17 VITALS — BP 132/78 | HR 77 | Ht 68.5 in | Wt 158.0 lb

## 2023-05-17 DIAGNOSIS — Z1211 Encounter for screening for malignant neoplasm of colon: Secondary | ICD-10-CM

## 2023-05-17 DIAGNOSIS — Z0000 Encounter for general adult medical examination without abnormal findings: Secondary | ICD-10-CM | POA: Diagnosis not present

## 2023-05-17 DIAGNOSIS — J432 Centrilobular emphysema: Secondary | ICD-10-CM | POA: Diagnosis not present

## 2023-05-17 NOTE — Patient Instructions (Addendum)
Thank you for coming to the office today.  A1c sugar, normal.   Recent Labs    05/10/23 0834  HGBA1C 4.8   Lipid Panel     Component Value Date/Time   CHOL 133 05/10/2023 0834   TRIG 132 05/10/2023 0834   HDL 30 (L) 05/10/2023 0834   CHOLHDL 4.4 05/10/2023 0834   LDLCALC 80 05/10/2023 0834   All labs look good No concerns.  Keep on Breztri inhaler. Twice a day.  -------------  Ordered the Cologuard (home kit) test for colon cancer screening. Stay tuned for further updates.  It will be shipped to you directly. If not received in 2-4 weeks, call us or the company.   If you send it back and no results are received in 2-4 weeks, call us or the company as well!   Colon Cancer Screening: - For all adults age 30+ routine colon cancer screening is highly recommended.     - Recent guidelines from [employer] Society recommend starting age of 38 - Early detection of colon cancer is important, because often there are no warning signs or symptoms, also if found early usually it can be cured. Late stage is hard to treat.   - If Cologuard is NEGATIVE, then it is good for 3 years before next due - If Cologuard is POSITIVE, then it is strongly advised to get a Colonoscopy, which allows the GI doctor to locate the source of the cancer or polyp (even very early stage) and treat it by removing it. ------------------------- Follow instructions to collect sample, you may call the company for any help or questions, 24/7 telephone support at [phone].  ----  DUE for FASTING BLOOD WORK (no food or drink after midnight before the lab appointment, only water or coffee without cream/sugar on the morning of)  SCHEDULE "Lab Only" visit in the morning at the clinic for lab draw in 1 YEAR  - Make sure Lab Only appointment is at about 1 week before your next appointment, so that results will be available  For Lab Results, once available within 2-3 days of blood draw, you can can log in to  MyChart online to view your results and a brief explanation. Also, we can discuss results at next follow-up visit.    Please schedule a Follow-up Appointment to: Return in about 1 year (around 05/16/2024) for 1 year fasting lab only then 1 week later Annual Physical.  If you have any other questions or concerns, please feel free to call the office or send a message through MyChart. You may also schedule an earlier appointment if necessary.  Additionally, you may be receiving a survey about your experience at our office within a few days to 1 week by e-mail or mail. We value your feedback.  Saralyn Pilar, DO [facility], [place]

## 2023-05-17 NOTE — Progress Notes (Signed)
Subjective:    Patient ID: Lucas Peterson, male    DOB: Oct 17, 1975, 47 y.o.   MRN: [id number]  Lucas Peterson is a 47 y.o. male presenting on 05/17/2023 for Annual Exam   HPI  Here for Annual Physical and Lab Review  Discussed the use of AI scribe software for clinical note transcription with the patient, who gave verbal consent to proceed.  History of Present Illness   The patient, a 47 year old with a history of respiratory issues, presented for a routine physical wellness check. They reported no new health problems since the last visit. The patient has been managing their respiratory issues with a new inhaler, but admitted to inconsistent usage and expressed uncertainty about its effectiveness. They also reported occasional cramping, attributing it to inadequate water intake.      He reports that the labs were not fasting. He had a soft drink before Glucose 103 mild elevated A1c 4.8 normal range He has fam history of Diabetes but he has not had this problem  HYPERLIPIDEMIA / HyperTG - Reports no concerns. Last lipid panel 2024, lab shows TC 133 and TG 132 and LDL 80 Lifestyle Dietary goals - Exercise: physically very active with exercise at work and yardwork   Tobacco Abuse Nicotine Dependence Has been years since last quit Smokes 1.5ppd   Anxiety Off Lexapro Off Buspar   Centrilobular Emphysema Active smoker, Tobacco Abuse On Breztri inhaler maintenance twice a day, occasional misses doses. On Albuterol AS NEEDED   Erectile Dysfunction Sildenafil not as effective. Improved on Tadalafil 20mg  as needed q 72 hr   Health Maintenance:  Declines Flu Vaccine  PSA 0.36, negative result.   Colon CA Screening age 96+ - ordered Cologuard.  Future Tdap next time.     05/17/2023    6:23 PM 03/15/2023    8:58 AM 04/15/2022    8:32 AM  Depression screen PHQ 2/9  Decreased Interest 0 0 0  Down, Depressed, Hopeless 0 0 0  PHQ - 2 Score 0 0 0  Altered  sleeping  2 2  Tired, decreased energy  1 2  Change in appetite  0 1  Feeling bad or failure about yourself   0 0  Trouble concentrating  0 0  Moving slowly or fidgety/restless  0 2  Suicidal thoughts  0 0  PHQ-9 Score  3 7  Difficult doing work/chores  Not difficult at all Not difficult at all    Past Medical History:  Diagnosis Date   Anxiety    Arthritis    COPD (chronic obstructive pulmonary disease) (HCC)    History reviewed. No pertinent surgical history. Social History   Socioeconomic History   Marital status: Married    Spouse name: Not on file   Number of children: Not on file   Years of education: High School   Highest education level: High school graduate  Occupational History   Not on file  Tobacco Use   Smoking status: Every Day    Current packs/day: 1.50    Average packs/day: 1.5 packs/day for 20.0 years (30.0 ttl pk-yrs)    Types: Cigarettes   Smokeless tobacco: Never   Tobacco comments:    1 1/2 pack a day  Vaping Use   Vaping status: Never Used  Substance and Sexual Activity   Alcohol use: Never   Drug use: Never   Sexual activity: Yes    Birth control/protection: None    Comment: Married  Other Topics Concern  Not on file  Social History Narrative   Not on file   Social Determinants of Health   Financial Resource Strain: Not on file  Food Insecurity: Not on file  Transportation Needs: Not on file  Physical Activity: Not on file  Stress: Stress Concern Present (06/20/2019)   Received from [facility], [facility]   [employer] of Occupational Health - Occupational Stress Questionnaire    Feeling of Stress : To some extent  Social Connections: Not on file  Intimate Partner Violence: Not on file   Family History  Problem Relation Age of Onset   Stroke Mother 55   Diabetes Mother    Anxiety disorder Mother    Arthritis Mother    Asthma Mother    Depression Mother    Obesity Mother     Varicose Veins Mother    Stroke Father 67   Alcohol abuse Father    Asthma Father    COPD Father    Thyroid disease Sister    Obesity Sister    Diabetes Brother    ADD / ADHD Brother    Obesity Brother    Learning disabilities Brother    Obesity Brother    Current Outpatient Medications on File Prior to Visit  Medication Sig   Aspirin-Salicylamide-Caffeine (BC FAST PAIN RELIEF) 650-195-33.3 MG PACK Take 1 packet by mouth 2 (two) times daily.   BREZTRI AEROSPHERE 160-9-4.8 MCG/ACT AERO Inhale 2 puffs into the lungs 2 (two) times daily.   tadalafil (CIALIS) 20 MG tablet Take 1 tablet (20 mg total) by mouth every other day as needed for erectile dysfunction.   VENTOLIN HFA 108 (90 Base) MCG/ACT inhaler TAKE 2 PUFFS BY MOUTH EVERY 6 HOURS AS NEEDED FOR WHEEZE OR SHORTNESS OF BREATH   No current facility-administered medications on file prior to visit.    Review of Systems  Constitutional:  Negative for activity change, appetite change, chills, diaphoresis, fatigue and fever.  HENT:  Negative for congestion and hearing loss.   Eyes:  Negative for visual disturbance.  Respiratory:  Negative for cough, chest tightness, shortness of breath and wheezing.   Cardiovascular:  Negative for chest pain, palpitations and leg swelling.  Gastrointestinal:  Negative for abdominal pain, constipation, diarrhea, nausea and vomiting.  Genitourinary:  Negative for dysuria, frequency and hematuria.  Musculoskeletal:  Negative for arthralgias and neck pain.  Skin:  Negative for rash.  Neurological:  Negative for dizziness, weakness, light-headedness, numbness and headaches.  Hematological:  Negative for adenopathy.  Psychiatric/Behavioral:  Negative for behavioral problems, dysphoric mood and sleep disturbance.    Per HPI unless specifically indicated above     Objective:    BP 132/78   Pulse 77   Ht 5' 8.5" (1.74 m)   Wt 158 lb (71.7 kg)   SpO2 96%   BMI 23.67 kg/m   Wt Readings from Last 3  Encounters:  05/17/23 158 lb (71.7 kg)  03/15/23 158 lb 9.6 oz (71.9 kg)  04/15/22 170 lb (77.1 kg)    Physical Exam Vitals and nursing note reviewed.  Constitutional:      General: He is not in acute distress.    Appearance: He is well-developed. He is not diaphoretic.     Comments: Well-appearing, comfortable, cooperative  HENT:     Head: Normocephalic and atraumatic.  Eyes:     General:        Right eye: No discharge.        Left eye: No  discharge.     Conjunctiva/sclera: Conjunctivae normal.     Pupils: Pupils are equal, round, and reactive to light.  Neck:     Thyroid: No thyromegaly.     Vascular: No carotid bruit.  Cardiovascular:     Rate and Rhythm: Normal rate and regular rhythm.     Pulses: Normal pulses.     Heart sounds: Normal heart sounds. No murmur heard. Pulmonary:     Effort: Pulmonary effort is normal. No respiratory distress.     Breath sounds: Normal breath sounds. No wheezing or rales.  Abdominal:     General: Bowel sounds are normal. There is no distension.     Palpations: Abdomen is soft. There is no mass.     Tenderness: There is no abdominal tenderness.  Musculoskeletal:        General: No tenderness. Normal range of motion.     Cervical back: Normal range of motion and neck supple.     Right lower leg: No edema.     Left lower leg: No edema.     Comments: Upper / Lower Extremities: - Normal muscle tone, strength bilateral upper extremities 5/5, lower extremities 5/5  Lymphadenopathy:     Cervical: No cervical adenopathy.  Skin:    General: Skin is warm and dry.     Findings: No erythema or rash.  Neurological:     Mental Status: He is alert and oriented to person, place, and time.     Comments: Distal sensation intact to light touch all extremities  Psychiatric:        Mood and Affect: Mood normal.        Behavior: Behavior normal.        Thought Content: Thought content normal.     Comments: Well groomed, good eye contact, normal speech  and thoughts       Results for orders placed or performed in visit on 05/10/23  TSH  Result Value Ref Range   TSH 1.62 0.40 - 4.50 mIU/L  PSA  Result Value Ref Range   PSA 0.36 < OR = 4.00 ng/mL  Lipid panel  Result Value Ref Range   Cholesterol 133 <200 mg/dL   HDL 30 (L) > OR = 40 mg/dL   Triglycerides 409 <811 mg/dL   LDL Cholesterol (Calc) 80 mg/dL (calc)   Total CHOL/HDL Ratio 4.4 <5.0 (calc)   Non-HDL Cholesterol (Calc) 103 <130 mg/dL (calc)  CBC with Differential/Platelet  Result Value Ref Range   WBC 8.6 3.8 - 10.8 Thousand/uL   RBC 5.19 4.20 - 5.80 Million/uL   Hemoglobin 16.3 13.2 - 17.1 g/dL   HCT 91.4 78.2 - 95.6 %   MCV 95.6 80.0 - 100.0 fL   MCH 31.4 27.0 - 33.0 pg   MCHC 32.9 32.0 - 36.0 g/dL   RDW 21.3 08.6 - 57.8 %   Platelets 173 140 - 400 Thousand/uL   MPV 11.0 7.5 - 12.5 fL   Neutro Abs 6,355 1,500 - 7,800 cells/uL   Lymphs Abs 1,582 850 - 3,900 cells/uL   Absolute Monocytes 439 200 - 950 cells/uL   Eosinophils Absolute 120 15 - 500 cells/uL   Basophils Absolute 103 0 - 200 cells/uL   Neutrophils Relative % 73.9 %   Total Lymphocyte 18.4 %   Monocytes Relative 5.1 %   Eosinophils Relative 1.4 %   Basophils Relative 1.2 %  Hemoglobin A1c  Result Value Ref Range   Hgb A1c MFr Bld 4.8 <5.7 % of total  Hgb   Mean Plasma Glucose 91 mg/dL   eAG (mmol/L) 5.0 mmol/L  COMPLETE METABOLIC PANEL WITH GFR  Result Value Ref Range   Glucose, Bld 103 (H) 65 - 99 mg/dL   BUN 12 7 - 25 mg/dL   Creat 8.46 9.62 - 9.52 mg/dL   eGFR 841 > OR = 60 LK/GMW/1.02V2   BUN/Creatinine Ratio SEE NOTE: 6 - 22 (calc)   Sodium 141 135 - 146 mmol/L   Potassium 4.8 3.5 - 5.3 mmol/L   Chloride 106 98 - 110 mmol/L   CO2 24 20 - 32 mmol/L   Calcium 8.9 8.6 - 10.3 mg/dL   Total Protein 6.4 6.1 - 8.1 g/dL   Albumin 4.0 3.6 - 5.1 g/dL   Globulin 2.4 1.9 - 3.7 g/dL (calc)   AG Ratio 1.7 1.0 - 2.5 (calc)   Total Bilirubin 0.4 0.2 - 1.2 mg/dL   Alkaline phosphatase (APISO) 73  36 - 130 U/L   AST 12 10 - 40 U/L   ALT 8 (L) 9 - 46 U/L      Assessment & Plan:   Problem List Items Addressed This Visit     Centrilobular emphysema (HCC)   Other Visit Diagnoses     Annual physical exam    -  Primary   Screening for colon cancer       Relevant Orders   Cologuard       Updated Health Maintenance information Reviewed recent lab results with patient Encouraged improvement to lifestyle with diet and exercise Goal of weight loss  Assessment and Plan    Colon Cancer Screening Discussed the importance of colon cancer screening at age 74. Reviewed the options of Cologuard home test vs colonoscopy. Patient prefers the home test. -Order Cologuard kit to be sent to patient's home.  Centrilobular Emphysema Patient reports inconsistent use of inhaler and no significant improvement in symptoms. Discussed the importance of consistent use and potential use of a spacer. -Continue current inhaler regimen and encourage consistent use.  Erectile Dysfunction Patient reports improvement with Tadalafil Cialis 20mg  PRN -Continue Tadalafil Cialis 20mg  as needed.  General Health Maintenance Tetanus vaccine due, but patient prefers to defer to next year.  Follow-up Annual physical exam completed with no significant findings. Labs within normal limits. No changes to current management. -Schedule follow-up visit in one year.       He is working with [employer] on nearsighted vision   Orders Placed This Encounter  Procedures   Cologuard      No orders of the defined types were placed in this encounter.     Follow up plan: Return in about 1 year (around 05/16/2024) for 1 year fasting lab only then 1 week later Annual Physical.  Saralyn Pilar, DO [facility] 05/17/2023, 2:41 PM

## 2023-05-31 ENCOUNTER — Telehealth: Payer: BC Managed Care – PPO | Admitting: Emergency Medicine

## 2023-05-31 DIAGNOSIS — J029 Acute pharyngitis, unspecified: Secondary | ICD-10-CM

## 2023-05-31 MED ORDER — AMOXICILLIN 500 MG PO CAPS: 500.0000 mg | ORAL_CAPSULE | Freq: Two times a day (BID) | ORAL | 0 refills | Status: AC

## 2023-05-31 NOTE — Progress Notes (Signed)
E-Visit for Sore Throat - Strep Symptoms ? ?We are sorry that you are not feeling well.  Here is how we plan to help! ? ?Based on what you have shared with me it is likely that you have strep pharyngitis.  Strep pharyngitis is inflammation and infection in the back of the throat.  This is an infection cause by bacteria and is treated with antibiotics.  I have prescribed Amoxicillin 500 mg twice a day for 10 days. For throat pain, we recommend over the counter oral pain relief medications such as acetaminophen or aspirin, or anti-inflammatory medications such as ibuprofen or naproxen sodium. Topical treatments such as oral throat lozenges or sprays may be used as needed. Strep infections are not as easily transmitted as other respiratory infections, however we still recommend that you avoid close contact with loved ones, especially the very young and elderly.  Remember to wash your hands thoroughly throughout the day as this is the number one way to prevent the spread of infection and wipe down door knobs and counters with disinfectant. ? ? ?Home Care: ?Only take medications as instructed by your medical team. ?Complete the entire course of an antibiotic. ?Do not take these medications with alcohol. ?A steam or ultrasonic humidifier can help congestion.  You can place a towel over your head and breathe in the steam from hot water coming from a faucet. ?Avoid close contacts especially the very young and the elderly. ?Cover your mouth when you cough or sneeze. ?Always remember to wash your hands. ? ?Get Help Right Away If: ?You develop worsening fever or sinus pain. ?You develop a severe head ache or visual changes. ?Your symptoms persist after you have completed your treatment plan. ? ?Make sure you ?Understand these instructions. ?Will watch your condition. ?Will get help right away if you are not doing well or get worse. ? ? ?Thank you for choosing an e-visit. ? ?Your e-visit answers were reviewed by a board  certified advanced clinical practitioner to complete your personal care plan. Depending upon the condition, your plan could have included both over the counter or prescription medications. ? ?Please review your pharmacy choice. Make sure the pharmacy is open so you can pick up prescription now. If there is a problem, you may contact your provider through [employer] and have the prescription routed to another pharmacy.  Your safety is important to [place]. If you have drug allergies check your prescription carefully.  ? ?For the next 24 hours you can use MyChart to ask questions about today's visit, request a non-urgent call back, or ask for a work or school excuse. ?You will get an email in the next two days asking about your experience. I hope that your e-visit has been valuable and will speed your recovery. ? ?I have spent 5 minutes in review of e-visit questionnaire, review and updating patient chart, medical decision making and response to patient.  ? ?Rica Mast, PhD, FNP-BC ?  ?

## 2023-06-01 ENCOUNTER — Other Ambulatory Visit: Payer: Self-pay | Admitting: Family Medicine

## 2023-06-01 DIAGNOSIS — J432 Centrilobular emphysema: Secondary | ICD-10-CM

## 2023-06-01 MED ORDER — VENTOLIN HFA 108 (90 BASE) MCG/ACT IN AERS: 1.0000 | INHALATION_SPRAY | Freq: Four times a day (QID) | RESPIRATORY_TRACT | 0 refills | Status: DC | PRN

## 2023-06-01 NOTE — Telephone Encounter (Signed)
Disp Refills Start End   VENTOLIN HFA 108 (90 Base) MCG/ACT inhaler 18 each 0 06/01/2023 --   Sig - Route: Inhale 1-2 puffs into the lungs every 6 (six) hours as needed for wheezing or shortness of breath. TAKE 2 PUFFS BY MOUTH EVERY 6 HOURS AS NEEDED FOR WHEEZE OR SHORTNESS OF BREATH - Inhalation   Sent to pharmacy as: VENTOLIN HFA 108 (90 Base) MCG/ACT inhaler   Notes to Pharmacy: Patient will need an office visit for further refills.   E-Prescribing Status: Receipt confirmed by pharmacy (06/01/2023  1:35 PM EDT)     Requested Prescriptions  Pending Prescriptions Disp Refills   VENTOLIN HFA 108 (90 Base) MCG/ACT inhaler [Pharmacy Med Name: VENTOLIN HFA 90 MCG INHALER] 18 each 0    Sig: TAKE 2 PUFFS BY MOUTH EVERY 6 HOURS AS NEEDED FOR WHEEZE OR SHORTNESS OF BREATH     Pulmonology:  Beta Agonists 2 Passed - 06/01/2023 10:37 AM      Passed - Last BP in normal range    BP Readings from Last 1 Encounters:  05/17/23 132/78         Passed - Last Heart Rate in normal range    Pulse Readings from Last 1 Encounters:  05/17/23 77         Passed - Valid encounter within last 12 months    Recent Outpatient Visits           2 weeks ago Annual physical exam   [place] [facility] Smitty Cords, DO   2 months ago Centrilobular emphysema [facility])   [place] [facility] Delles, Gentry Fitz A, RPH-CPP   2 months ago Centrilobular emphysema [facility])   [place] [facility] Smitty Cords, DO   1 year ago Annual physical exam   [place] [facility] Smitty Cords, DO   1 year ago GAD (generalized anxiety disorder)   [place] [facility] Althea Charon, Netta Neat, DO       Future Appointments             In 12 months Althea Charon, Netta Neat, DO [place] [facility], [facility]

## 2023-06-29 ENCOUNTER — Other Ambulatory Visit: Payer: Self-pay | Admitting: Family Medicine

## 2023-06-29 DIAGNOSIS — J432 Centrilobular emphysema: Secondary | ICD-10-CM

## 2023-06-30 NOTE — Telephone Encounter (Signed)
Requested Prescriptions  Pending Prescriptions Disp Refills   VENTOLIN HFA 108 (90 Base) MCG/ACT inhaler [Pharmacy Med Name: VENTOLIN HFA 90 MCG INHALER] 18 each 0    Sig: INHALE 1 OR 2 PUFFS EVERY 6 HOURS AS NEEDED FOR WHEEZE OR FOR SHORTNESS OF BREATH     Pulmonology:  Beta Agonists 2 Passed - 06/29/2023  1:19 AM      Passed - Last BP in normal range    BP Readings from Last 1 Encounters:  05/17/23 132/78         Passed - Last Heart Rate in normal range    Pulse Readings from Last 1 Encounters:  05/17/23 77         Passed - Valid encounter within last 12 months    Recent Outpatient Visits           1 month ago Annual physical exam   [place] [facility] [place], Netta Neat, DO   3 months ago Centrilobular emphysema [facility])   [place] [facility] Delles, Gentry Fitz A, RPH-CPP   3 months ago Centrilobular emphysema [facility])   [place] [facility] Smitty Cords, DO   1 year ago Annual physical exam   [place] [facility] Smitty Cords, DO   1 year ago GAD (generalized anxiety disorder)   [place] [facility] Althea Charon, Netta Neat, DO       Future Appointments             In 11 months Althea Charon, Netta Neat, DO [place] [facility], [facility]

## 2023-07-07 ENCOUNTER — Telehealth: Payer: BC Managed Care – PPO | Admitting: Physician Assistant

## 2023-07-07 DIAGNOSIS — J020 Streptococcal pharyngitis: Secondary | ICD-10-CM | POA: Diagnosis not present

## 2023-07-07 MED ORDER — AZITHROMYCIN 250 MG PO TABS
ORAL_TABLET | ORAL | 0 refills | Status: AC
Start: 2023-07-07 — End: 2023-07-12

## 2023-07-07 NOTE — Progress Notes (Signed)
E-Visit for Sore Throat - Strep Symptoms  We are sorry that you are not feeling well.  Here is how we plan to help!  Based on what you have shared with me it is likely that you have strep pharyngitis.  Strep pharyngitis is inflammation and infection in the back of the throat.  This is an infection cause by bacteria and is treated with antibiotics.  I have prescribed Azithromycin 250 mg two tablets today and then one daily for 4 additional days. For throat pain, we recommend over the counter oral pain relief medications such as acetaminophen or aspirin, or anti-inflammatory medications such as ibuprofen or naproxen sodium. Topical treatments such as oral throat lozenges or sprays may be used as needed. Strep infections are not as easily transmitted as other respiratory infections, however we still recommend that you avoid close contact with loved ones, especially the very young and elderly.  Remember to wash your hands thoroughly throughout the day as this is the number one way to prevent the spread of infection and wipe down door knobs and counters with disinfectant.   Home Care: Only take medications as instructed by your medical team. Complete the entire course of an antibiotic. Do not take these medications with alcohol. A steam or ultrasonic humidifier can help congestion.  You can place a towel over your head and breathe in the steam from hot water coming from a faucet. Avoid close contacts especially the very young and the elderly. Cover your mouth when you cough or sneeze. Always remember to wash your hands.  Get Help Right Away If: You develop worsening fever or sinus pain. You develop a severe head ache or visual changes. Your symptoms persist after you have completed your treatment plan.  Make sure you Understand these instructions. Will watch your condition. Will get help right away if you are not doing well or get worse.   Thank you for choosing an e-visit.  Your e-visit  answers were reviewed by a board certified advanced clinical practitioner to complete your personal care plan. Depending upon the condition, your plan could have included both over the counter or prescription medications.  Please review your pharmacy choice. Make sure the pharmacy is open so you can pick up prescription now. If there is a problem, you may contact your provider through [employer] and have the prescription routed to another pharmacy.  Your safety is important to [place]. If you have drug allergies check your prescription carefully.   For the next 24 hours you can use MyChart to ask questions about today's visit, request a non-urgent call back, or ask for a work or school excuse. You will get an email in the next two days asking about your experience. I hope that your e-visit has been valuable and will speed your recovery.

## 2023-07-07 NOTE — Progress Notes (Signed)
I have spent 5 minutes in review of e-visit questionnaire, review and updating patient chart, medical decision making and response to patient.   Mia Milan Cody Jacklynn Dehaas, PA-C    

## 2023-07-27 ENCOUNTER — Other Ambulatory Visit: Payer: Self-pay | Admitting: Family Medicine

## 2023-07-27 DIAGNOSIS — F411 Generalized anxiety disorder: Secondary | ICD-10-CM

## 2023-07-28 NOTE — Telephone Encounter (Signed)
Requested Prescriptions  Pending Prescriptions Disp Refills   busPIRone (BUSPAR) 5 MG tablet [Pharmacy Med Name: BUSPIRONE HCL 5 MG TABLET] 90 tablet 2    Sig: TAKE 1 TABLET (5 MG TOTAL) BY MOUTH 3 (THREE) TIMES DAILY AS NEEDED (ANXIETY).     Psychiatry: Anxiolytics/Hypnotics - Non-controlled Passed - 07/27/2023  8:55 PM      Passed - Valid encounter within last 12 months    Recent Outpatient Visits           2 months ago Annual physical exam   [place] [facility] Smitty Cords, DO   4 months ago Centrilobular emphysema [facility])   [place] [facility] Delles, Gentry Fitz A, RPH-CPP   4 months ago Centrilobular emphysema [facility])   [place] [facility] Smitty Cords, DO   1 year ago Annual physical exam   [place] [facility] Smitty Cords, DO   1 year ago GAD (generalized anxiety disorder)   [place] [facility] Althea Charon, Netta Neat, DO       Future Appointments             In 10 months Althea Charon, Netta Neat, DO  [facility], [facility]

## 2023-09-24 ENCOUNTER — Telehealth: Payer: BC Managed Care – PPO | Admitting: Physician Assistant

## 2023-09-24 DIAGNOSIS — J020 Streptococcal pharyngitis: Secondary | ICD-10-CM | POA: Diagnosis not present

## 2023-09-24 MED ORDER — AMOXICILLIN 500 MG PO CAPS
500.0000 mg | ORAL_CAPSULE | Freq: Two times a day (BID) | ORAL | 0 refills | Status: AC
Start: 2023-09-24 — End: 2023-10-04

## 2023-09-24 NOTE — Progress Notes (Signed)

## 2023-10-06 ENCOUNTER — Other Ambulatory Visit: Payer: Self-pay | Admitting: Internal Medicine

## 2023-10-06 DIAGNOSIS — N529 Male erectile dysfunction, unspecified: Secondary | ICD-10-CM

## 2023-10-06 NOTE — Telephone Encounter (Signed)
The original prescription was discontinued on 03/15/2023 by Smitty Cords, DO for the following reason: Change in therapy.   Requested Prescriptions  Pending Prescriptions Disp Refills   sildenafil (REVATIO) 20 MG tablet [Pharmacy Med Name: Sildenafil Citrate 20 MG Oral Tablet] 90 tablet 0    Sig: TAKE 2 TO 5 TABLETS BY MOUTH ABOUT 30 MINUTES PRIOR TO SEX AS NEEDED     Urology: Erectile Dysfunction Agents Failed - 10/06/2023  4:51 PM      Failed - ALT in normal range and within 360 days    ALT  Date Value Ref Range Status  05/10/2023 8 (L) 9 - 46 U/L Final         Passed - AST in normal range and within 360 days    AST  Date Value Ref Range Status  05/10/2023 12 10 - 40 U/L Final         Passed - Last BP in normal range    BP Readings from Last 1 Encounters:  05/17/23 132/78         Passed - Valid encounter within last 12 months    Recent Outpatient Visits           4 months ago Annual physical exam   [place] [facility] Smitty Cords, DO   6 months ago Centrilobular emphysema [facility])   [place] [facility] Delles, Gentry Fitz A, RPH-CPP   6 months ago Centrilobular emphysema [facility])   [place] [facility] Smitty Cords, DO   1 year ago Annual physical exam   [place] [facility] Smitty Cords, DO   1 year ago GAD (generalized anxiety disorder)   [place] [facility] Althea Charon, Netta Neat, DO       Future Appointments             In 7 months Althea Charon, Netta Neat, DO [place] [facility], [facility]

## 2023-11-03 ENCOUNTER — Other Ambulatory Visit: Payer: Self-pay | Admitting: Family Medicine

## 2023-11-03 DIAGNOSIS — J432 Centrilobular emphysema: Secondary | ICD-10-CM

## 2023-11-04 NOTE — Telephone Encounter (Signed)
 Requested Prescriptions  Pending Prescriptions Disp Refills   VENTOLIN HFA 108 (90 Base) MCG/ACT inhaler [Pharmacy Med Name: VENTOLIN HFA 90 MCG INHALER] 18 each 0    Sig: INHALE 1 OR 2 PUFFS EVERY 6 HOURS AS NEEDED FOR WHEEZE OR FOR SHORTNESS OF BREATH     Pulmonology:  Beta Agonists 2 Passed - 11/04/2023  9:27 AM      Passed - Last BP in normal range    BP Readings from Last 1 Encounters:  05/17/23 132/78         Passed - Last Heart Rate in normal range    Pulse Readings from Last 1 Encounters:  05/17/23 77         Passed - Valid encounter within last 12 months    Recent Outpatient Visits           5 months ago Annual physical exam   [place] [facility] Smitty Cords, DO   7 months ago Centrilobular emphysema [facility])   [place] [facility] Delles, Gentry Fitz A, RPH-CPP   7 months ago Centrilobular emphysema [facility])   [place] Smitty Cords, DO   1 year ago Annual physical exam   [place] [facility] Smitty Cords, DO   1 year ago GAD (generalized anxiety disorder)   [place] [facility] [facility] Althea Charon, Netta Neat, DO       Future Appointments             In 6 months Althea Charon, Netta Neat, DO [facility], [facility]

## 2023-12-28 ENCOUNTER — Other Ambulatory Visit: Payer: Self-pay | Admitting: Family Medicine

## 2023-12-28 DIAGNOSIS — J432 Centrilobular emphysema: Secondary | ICD-10-CM

## 2023-12-29 NOTE — Telephone Encounter (Signed)
 OV 05/17/23 Requested Prescriptions  Pending Prescriptions Disp Refills   VENTOLIN  HFA 108 (90 Base) MCG/ACT inhaler [Pharmacy Med Name: VENTOLIN  HFA 90 MCG INHALER] 18 each 0    Sig: INHALE 1 OR 2 PUFFS EVERY 6 HOURS AS NEEDED FOR WHEEZE OR FOR SHORTNESS OF BREATH     Pulmonology:  Beta Agonists 2 Failed - 12/29/2023  3:29 PM      Failed - Valid encounter within last 12 months    Recent Outpatient Visits   None     Future Appointments             In 5 months Romeo Co, Kayleen Party, DO  [facility], [facility]            Passed - Last BP in normal range    BP Readings from Last 1 Encounters:  05/17/23 132/78         Passed - Last Heart Rate in normal range    Pulse Readings from Last 1 Encounters:  05/17/23 77

## 2024-01-21 ENCOUNTER — Other Ambulatory Visit: Payer: Self-pay | Admitting: Family Medicine

## 2024-01-21 DIAGNOSIS — J432 Centrilobular emphysema: Secondary | ICD-10-CM

## 2024-01-21 NOTE — Telephone Encounter (Signed)
 Requested Prescriptions  Pending Prescriptions Disp Refills   VENTOLIN  HFA 108 (90 Base) MCG/ACT inhaler [Pharmacy Med Name: VENTOLIN  HFA 90 MCG INHALER] 18 each 0    Sig: INHALE 1 OR 2 PUFFS EVERY 6 HOURS AS NEEDED FOR WHEEZE OR FOR SHORTNESS OF BREATH     Pulmonology:  Beta Agonists 2 Failed - 01/21/2024  2:33 PM      Failed - Valid encounter within last 12 months    Recent Outpatient Visits   None     Future Appointments             In 4 months Romeo Co, Kayleen Party, DO [place], [facility]            Passed - Last BP in normal range    BP Readings from Last 1 Encounters:  05/17/23 132/78         Passed - Last Heart Rate in normal range    Pulse Readings from Last 1 Encounters:  05/17/23 77

## 2024-03-09 ENCOUNTER — Other Ambulatory Visit: Payer: Self-pay | Admitting: Family Medicine

## 2024-03-09 DIAGNOSIS — J432 Centrilobular emphysema: Secondary | ICD-10-CM

## 2024-03-10 NOTE — Telephone Encounter (Signed)
 Requested Prescriptions  Pending Prescriptions Disp Refills   VENTOLIN  HFA 108 (90 Base) MCG/ACT inhaler [Pharmacy Med Name: VENTOLIN  HFA 90 MCG INHALER] 18 each 0    Sig: INHALE 1 OR 2 PUFFS EVERY 6 HOURS AS NEEDED FOR WHEEZE OR FOR SHORTNESS OF BREATH     Pulmonology:  Beta Agonists 2 Failed - 03/10/2024 12:31 PM      Failed - Valid encounter within last 12 months    Recent Outpatient Visits   None     Future Appointments             In 2 months [place] Marsa PARAS, DO [place] [facility], [facility]            Passed - Last BP in normal range    BP Readings from Last 1 Encounters:  05/17/23 132/78         Passed - Last Heart Rate in normal range    Pulse Readings from Last 1 Encounters:  05/17/23 77

## 2024-04-06 ENCOUNTER — Other Ambulatory Visit: Payer: Self-pay | Admitting: Family Medicine

## 2024-04-06 DIAGNOSIS — J432 Centrilobular emphysema: Secondary | ICD-10-CM

## 2024-04-07 NOTE — Telephone Encounter (Signed)
 Requested medication (s) are due for refill today -yes  Requested medication (s) are on the active medication list -yes  Future visit scheduled -yes  Last refill: 03/15/23 10.7g 11RF  Notes to clinic: off protocol- provider review   Requested Prescriptions  Pending Prescriptions Disp Refills   BREZTRI  AEROSPHERE 160-9-4.8 MCG/ACT AERO inhaler [Pharmacy Med Name: BREZTRI  160-9-4.8 MCG/ACT  AER] 33 g 0    Sig: INHALE 2 PUFFS TWICE DAILY     Off-Protocol Failed - 04/07/2024 12:07 PM      Failed - Medication not assigned to a protocol, review manually.      Failed - Valid encounter within last 12 months    Recent Outpatient Visits   None     Future Appointments             In 1 month Karamalegos, Marsa PARAS, DO [place] [facility], [facility]               Requested Prescriptions  Pending Prescriptions Disp Refills   BREZTRI  AEROSPHERE 160-9-4.8 MCG/ACT AERO inhaler [Pharmacy Med Name: BREZTRI  160-9-4.8 MCG/ACT  AER] 33 g 0    Sig: INHALE 2 PUFFS TWICE DAILY     Off-Protocol Failed - 04/07/2024 12:07 PM      Failed - Medication not assigned to a protocol, review manually.      Failed - Valid encounter within last 12 months    Recent Outpatient Visits   None     Future Appointments             In 1 month Karamalegos, Marsa PARAS, DO [place] [facility], [facility]

## 2024-04-15 ENCOUNTER — Telehealth: Admitting: Family Medicine

## 2024-04-15 DIAGNOSIS — J029 Acute pharyngitis, unspecified: Secondary | ICD-10-CM | POA: Diagnosis not present

## 2024-04-15 DIAGNOSIS — J40 Bronchitis, not specified as acute or chronic: Secondary | ICD-10-CM | POA: Diagnosis not present

## 2024-04-15 MED ORDER — BENZONATATE 200 MG PO CAPS: 200.0000 mg | ORAL_CAPSULE | Freq: Two times a day (BID) | ORAL | 0 refills | Status: DC | PRN

## 2024-04-15 MED ORDER — AZITHROMYCIN 250 MG PO TABS: ORAL_TABLET | ORAL | 0 refills | Status: AC

## 2024-04-15 NOTE — Progress Notes (Signed)

## 2024-04-19 ENCOUNTER — Other Ambulatory Visit: Payer: Self-pay | Admitting: Family Medicine

## 2024-04-19 DIAGNOSIS — N529 Male erectile dysfunction, unspecified: Secondary | ICD-10-CM

## 2024-04-20 NOTE — Telephone Encounter (Signed)
 Requested medication (s) are due for refill today:   Yes  Requested medication (s) are on the active medication list:   Yes  Future visit scheduled:   Yes 10/13    LOV 05/17/2023    Last ordered: 03/15/2023 #90, 3 refills   Unable to refill because labs are due and OV.    Has an upcoming appt  Requested Prescriptions  Pending Prescriptions Disp Refills   tadalafil  (CIALIS ) 20 MG tablet [Pharmacy Med Name: Tadalafil  20 MG Oral Tablet] 90 tablet 0    Sig: TAKE 1 TABLET BY MOUTH EVERY OTHER DAY AS NEEDED FOR ERECTILE DYSFUNCTION     Urology: Erectile Dysfunction Agents Failed - 04/20/2024  1:01 PM      Failed - ALT in normal range and within 360 days    ALT  Date Value Ref Range Status  05/10/2023 8 (L) 9 - 46 U/L Final         Failed - Valid encounter within last 12 months    Recent Outpatient Visits   None     Future Appointments             In 1 month Karamalegos, Marsa PARAS, DO [place] [facility], [address]            Passed - AST in normal range and within 360 days    AST  Date Value Ref Range Status  05/10/2023 12 10 - 40 U/L Final         Passed - Last BP in normal range    BP Readings from Last 1 Encounters:  05/17/23 132/78

## 2024-04-21 ENCOUNTER — Telehealth: Admitting: Physician Assistant

## 2024-04-21 DIAGNOSIS — J029 Acute pharyngitis, unspecified: Secondary | ICD-10-CM

## 2024-04-21 NOTE — Progress Notes (Signed)
  Because you have failed a first-line treatment, I feel your condition warrants further evaluation and I recommend that you be seen in a face-to-face visit.   NOTE: There will be NO CHARGE for this E-Visit   If you are having a true medical emergency, please call 911.     For an urgent face to face visit, [place] has multiple urgent care centers for your convenience.  Click the link below for the full list of locations and hours, walk-in wait times, appointment scheduling options and driving directions:  Urgent Care - [place], [place], [place], [place], [place], [place]  [place]     Your MyChart E-visit questionnaire answers were reviewed by a board certified advanced clinical practitioner to complete your personal care plan based on your specific symptoms.    Thank you for using e-Visits.   I have spent 5 minutes in review of e-visit questionnaire, review and updating patient chart, medical decision making and response to patient.   Margaretann Loveless, PA-C

## 2024-04-22 ENCOUNTER — Ambulatory Visit: Payer: Self-pay

## 2024-05-15 ENCOUNTER — Other Ambulatory Visit: Payer: Self-pay | Admitting: Family Medicine

## 2024-05-15 DIAGNOSIS — J432 Centrilobular emphysema: Secondary | ICD-10-CM

## 2024-05-16 ENCOUNTER — Other Ambulatory Visit: Payer: BC Managed Care – PPO

## 2024-05-16 NOTE — Telephone Encounter (Signed)
 Requested medications are due for refill today.  yes  Requested medications are on the active medications list.  yes  Last refill. 04/07/2024 33g 0 rf  Future visit scheduled.   yes  Notes to clinic.  Medication not assigned to a protocol.    Requested Prescriptions  Pending Prescriptions Disp Refills   BREZTRI  AEROSPHERE 160-9-4.8 MCG/ACT AERO inhaler [Pharmacy Med Name: BREZTRI  160-9-4.8 MCG/ACT  AER] 33 g 0    Sig: INHALE 2 PUFFS TWICE DAILY     Off-Protocol Failed - 05/16/2024  4:45 PM      Failed - Medication not assigned to a protocol, review manually.      Failed - Valid encounter within last 12 months    Recent Outpatient Visits   None     Future Appointments             In 1 week Karamalegos, Marsa PARAS, DO [place] [facility], Main [address]

## 2024-05-19 ENCOUNTER — Other Ambulatory Visit: Payer: Self-pay | Admitting: Family Medicine

## 2024-05-19 DIAGNOSIS — Z0000 Encounter for general adult medical examination without abnormal findings: Secondary | ICD-10-CM

## 2024-05-19 DIAGNOSIS — J432 Centrilobular emphysema: Secondary | ICD-10-CM

## 2024-05-19 DIAGNOSIS — E781 Pure hyperglyceridemia: Secondary | ICD-10-CM

## 2024-05-19 DIAGNOSIS — F411 Generalized anxiety disorder: Secondary | ICD-10-CM

## 2024-05-19 DIAGNOSIS — R7309 Other abnormal glucose: Secondary | ICD-10-CM

## 2024-05-19 DIAGNOSIS — Z125 Encounter for screening for malignant neoplasm of prostate: Secondary | ICD-10-CM

## 2024-05-22 ENCOUNTER — Other Ambulatory Visit: Payer: Self-pay

## 2024-05-22 DIAGNOSIS — J432 Centrilobular emphysema: Secondary | ICD-10-CM | POA: Diagnosis not present

## 2024-05-22 DIAGNOSIS — Z125 Encounter for screening for malignant neoplasm of prostate: Secondary | ICD-10-CM | POA: Diagnosis not present

## 2024-05-22 DIAGNOSIS — R7309 Other abnormal glucose: Secondary | ICD-10-CM | POA: Diagnosis not present

## 2024-05-22 DIAGNOSIS — E781 Pure hyperglyceridemia: Secondary | ICD-10-CM | POA: Diagnosis not present

## 2024-05-22 LAB — LIPID PANEL
Cholesterol: 144 mg/dL (ref <200)
HDL: 32 mg/dL — ABNORMAL LOW (ref >=40)
LDL Cholesterol (Calc): 83 mg/dL
Non-HDL Cholesterol (Calc): 112 mg/dL (ref <130)
Total CHOL/HDL Ratio: 4.5 (calc) (ref <5.0)
Triglycerides: 196 mg/dL — ABNORMAL HIGH (ref <150)

## 2024-05-22 LAB — COMPREHENSIVE METABOLIC PANEL WITH GFR
AG Ratio: 1.6 (calc) (ref 1.0–2.5)
ALT: 7 U/L — ABNORMAL LOW (ref 9–46)
AST: 12 U/L (ref 10–40)
Albumin: 4.2 g/dL (ref 3.6–5.1)
Alkaline phosphatase (APISO): 65 U/L (ref 36–130)
BUN: 13 mg/dL (ref 7–25)
CO2: 26 mmol/L (ref 20–32)
Calcium: 9.1 mg/dL (ref 8.6–10.3)
Chloride: 106 mmol/L (ref 98–110)
Creat: 0.7 mg/dL (ref 0.60–1.29)
Globulin: 2.6 g/dL (ref 1.9–3.7)
Glucose, Bld: 110 mg/dL (ref 65–139)
Potassium: 4.5 mmol/L (ref 3.5–5.3)
Sodium: 142 mmol/L (ref 135–146)
Total Bilirubin: 0.4 mg/dL (ref 0.2–1.2)
Total Protein: 6.8 g/dL (ref 6.1–8.1)
eGFR: 115 mL/min/1.73m2 (ref >=60)

## 2024-05-22 LAB — HEMOGLOBIN A1C
Hgb A1c MFr Bld: 4.7 % (ref <5.7)
Mean Plasma Glucose: 88 mg/dL
eAG (mmol/L): 4.9 mmol/L

## 2024-05-22 LAB — PSA: PSA: 0.32 ng/mL (ref <=4.00)

## 2024-05-22 LAB — CBC WITH DIFFERENTIAL/PLATELET
Absolute Lymphocytes: 1235 {cells}/uL (ref 850–3900)
Absolute Monocytes: 475 {cells}/uL (ref 200–950)
Basophils Absolute: 114 {cells}/uL (ref 0–200)
Basophils Relative: 1.2 %
Eosinophils Absolute: 124 {cells}/uL (ref 15–500)
Eosinophils Relative: 1.3 %
HCT: 51.6 % — ABNORMAL HIGH (ref 38.5–50.0)
Hemoglobin: 17 g/dL (ref 13.2–17.1)
MCH: 31.3 pg (ref 27.0–33.0)
MCHC: 32.9 g/dL (ref 32.0–36.0)
MCV: 94.9 fL (ref 80.0–100.0)
MPV: 10.7 fL (ref 7.5–12.5)
Monocytes Relative: 5 %
Neutro Abs: 7553 {cells}/uL (ref 1500–7800)
Neutrophils Relative %: 79.5 %
Platelets: 172 Thousand/uL (ref 140–400)
RBC: 5.44 Million/uL (ref 4.20–5.80)
RDW: 12.4 % (ref 11.0–15.0)
Total Lymphocyte: 13 %
WBC: 9.5 Thousand/uL (ref 3.8–10.8)

## 2024-05-22 LAB — TSH: TSH: 2.17 m[IU]/L (ref 0.40–4.50)

## 2024-05-29 ENCOUNTER — Encounter: Payer: Self-pay | Admitting: Family Medicine

## 2024-05-29 ENCOUNTER — Ambulatory Visit: Payer: Self-pay | Admitting: Family Medicine

## 2024-05-29 VITALS — BP 124/76 | HR 72 | Ht 68.5 in | Wt 156.1 lb

## 2024-05-29 DIAGNOSIS — F411 Generalized anxiety disorder: Secondary | ICD-10-CM

## 2024-05-29 DIAGNOSIS — Z0000 Encounter for general adult medical examination without abnormal findings: Secondary | ICD-10-CM

## 2024-05-29 DIAGNOSIS — N529 Male erectile dysfunction, unspecified: Secondary | ICD-10-CM

## 2024-05-29 DIAGNOSIS — J432 Centrilobular emphysema: Secondary | ICD-10-CM

## 2024-05-29 DIAGNOSIS — Z1211 Encounter for screening for malignant neoplasm of colon: Secondary | ICD-10-CM

## 2024-05-29 DIAGNOSIS — E781 Pure hyperglyceridemia: Secondary | ICD-10-CM | POA: Diagnosis not present

## 2024-05-29 DIAGNOSIS — R7309 Other abnormal glucose: Secondary | ICD-10-CM

## 2024-05-29 MED ORDER — TRELEGY ELLIPTA 100-62.5-25 MCG/ACT IN AEPB
1.0000 | INHALATION_SPRAY | Freq: Every day | RESPIRATORY_TRACT | 3 refills | Status: AC
Start: 2024-05-29 — End: ?

## 2024-05-29 MED ORDER — SILDENAFIL CITRATE 20 MG PO TABS: ORAL_TABLET | ORAL | 3 refills | Status: AC

## 2024-05-29 NOTE — Progress Notes (Signed)
 Subjective:    Patient ID: Lucas Peterson, male    DOB: 22-Sep-1975, 48 y.o.   MRN: [id number]  Lucas Peterson is a 48 y.o. male presenting on 05/29/2024 for Annual Exam   HPI  Discussed the use of AI scribe software for clinical note transcription with the patient, who gave verbal consent to proceed.  History of Present Illness   Lucas Peterson is a 48 year old male who presents for an annual physical exam.   Visual disturbance and erectile dysfunction medication - Experiences blurry vision, attributed to aging and prolonged computer use - Visual accommodation  Easy bruising - Experiences easy bruising, especially on arms, after minor trauma - Works outdoors and attributes bruising to aging and smoking       He reports that the labs were not fasting. He had a soft drink before Glucose 103 mild elevated A1c 4.8 normal range He has fam history of Diabetes but he has not had this problem   HYPERLIPIDEMIA / HyperTG - Reports no concerns. Last lipid panel 2025, lab shows TC 133 and TG 132 and LDL 80 Lifestyle Dietary goals - Exercise: physically very active with exercise at work and yardwork   Tobacco Abuse Nicotine Dependence Has been years since last quit Smokes 1.5ppd   Anxiety Off Lexapro  Off Buspar    Centrilobular Emphysema Active smoker, Tobacco Abuse On Breztri  inhaler maintenance twice a day, occasional misses doses. On Albuterol  AS NEEDED   Erectile Dysfunction Sildenafil  not as effective. Improved on Tadalafil  20mg  as needed q 72 hr      Health Maintenance:   Declines Flu Vaccine   PSA 0.32, negative result.   Colon CA Screening age 9+ - ordered Cologuard.   Future Tdap next time.      05/29/2024    1:26 PM 05/17/2023    6:23 PM 03/15/2023    8:58 AM  Depression screen PHQ 2/9  Decreased Interest 0 0 0  Down, Depressed, Hopeless 0 0 0  PHQ - 2 Score 0 0 0  Altered sleeping 3  2  Tired, decreased energy 0  1  Change in  appetite 0  0  Feeling bad or failure about yourself  0  0  Trouble concentrating 0  0  Moving slowly or fidgety/restless 3  0  Suicidal thoughts 0  0  PHQ-9 Score 6  3  Difficult doing work/chores Not difficult at all  Not difficult at all       05/29/2024    1:27 PM 03/15/2023    8:58 AM 04/15/2022    8:32 AM 01/07/2022    1:43 PM  GAD 7 : Generalized Anxiety Score  Nervous, Anxious, on Edge 3 1 2 2   Control/stop worrying 1 0 1 2  Worry too much - different things 1 0 1 2  Trouble relaxing 3 1 1 2   Restless 3 1 1 2   Easily annoyed or irritable 1 1 2 3   Afraid - awful might happen 0 0 0 2  Total GAD 7 Score 12 4 8 15   Anxiety Difficulty Somewhat difficult Not difficult at all Somewhat difficult Somewhat difficult     Past Medical History:  Diagnosis Date   Anxiety    Arthritis    COPD (chronic obstructive pulmonary disease) (HCC)    History reviewed. No pertinent surgical history. Social History   Socioeconomic History   Marital status: Married    Spouse name: Not on file   Number of children: Not on  file   Years of education: High School   Highest education level: High school graduate  Occupational History   Not on file  Tobacco Use   Smoking status: Every Day    Current packs/day: 1.50    Average packs/day: 1.5 packs/day for 20.0 years (30.0 ttl pk-yrs)    Types: Cigarettes   Smokeless tobacco: Never   Tobacco comments:    1 1/2 pack a day  Vaping Use   Vaping status: Never Used  Substance and Sexual Activity   Alcohol use: Never   Drug use: Never   Sexual activity: Yes    Birth control/protection: None    Comment: Married  Other Topics Concern   Not on file  Social History Narrative   Not on file   Social Drivers of Health   Financial Resource Strain: Not on file  Food Insecurity: Not on file  Transportation Needs: Not on file  Physical Activity: Not on file  Stress: Stress Concern Present (06/20/2019)   Received from [facility]   [employer] of Occupational Health - Occupational Stress Questionnaire    Feeling of Stress : To some extent  Social Connections: Not on file  Intimate Partner Violence: Not on file   Family History  Problem Relation Age of Onset   Stroke Mother 72   Diabetes Mother    Anxiety disorder Mother    Arthritis Mother    Asthma Mother    Depression Mother    Obesity Mother    Varicose Veins Mother    Stroke Father 13   Alcohol abuse Father    Asthma Father    COPD Father    Thyroid disease Sister    Obesity Sister    Diabetes Brother    ADD / ADHD Brother    Obesity Brother    Learning disabilities Brother    Obesity Brother    Current Outpatient Medications on File Prior to Visit  Medication Sig   Aspirin-Salicylamide-Caffeine (BC FAST PAIN RELIEF) 650-195-33.3 MG PACK Take 1 packet by mouth 2 (two) times daily.   VENTOLIN  HFA 108 (90 Base) MCG/ACT inhaler INHALE 1 OR 2 PUFFS EVERY 6 HOURS AS NEEDED FOR WHEEZE OR FOR SHORTNESS OF BREATH   No current facility-administered medications on file prior to visit.    Review of Systems  Constitutional:  Negative for activity change, appetite change, chills, diaphoresis, fatigue and fever.  HENT:  Negative for congestion and hearing loss.   Eyes:  Negative for visual disturbance.  Respiratory:  Negative for cough, chest tightness, shortness of breath and wheezing.   Cardiovascular:  Negative for chest pain, palpitations and leg swelling.  Gastrointestinal:  Negative for abdominal pain, constipation, diarrhea, nausea and vomiting.  Genitourinary:  Negative for dysuria, frequency and hematuria.  Musculoskeletal:  Negative for arthralgias and neck pain.  Skin:  Negative for rash.  Neurological:  Negative for dizziness, weakness, light-headedness, numbness and headaches.  Hematological:  Negative for adenopathy.  Psychiatric/Behavioral:  Negative for behavioral problems, dysphoric mood and sleep disturbance.    Per HPI  unless specifically indicated above     Objective:    BP 124/76 (BP Location: Left Arm, Patient Position: Sitting, Cuff Size: Normal)   Pulse 72   Ht 5' 8.5 (1.74 m)   Wt 156 lb 2 oz (70.8 kg)   SpO2 96%   BMI 23.39 kg/m   Wt Readings from Last 3 Encounters:  05/29/24 156 lb 2 oz (70.8 kg)  05/17/23 158 lb (  71.7 kg)  03/15/23 158 lb 9.6 oz (71.9 kg)    Physical Exam Vitals and nursing note reviewed.  Constitutional:      General: He is not in acute distress.    Appearance: He is well-developed. He is not diaphoretic.     Comments: Well-appearing, comfortable, cooperative  HENT:     Head: Normocephalic and atraumatic.  Eyes:     General:        Right eye: No discharge.        Left eye: No discharge.     Conjunctiva/sclera: Conjunctivae normal.     Pupils: Pupils are equal, round, and reactive to light.  Neck:     Thyroid: No thyromegaly.  Cardiovascular:     Rate and Rhythm: Normal rate and regular rhythm.     Pulses: Normal pulses.     Heart sounds: Normal heart sounds. No murmur heard. Pulmonary:     Effort: Pulmonary effort is normal. No respiratory distress.     Breath sounds: Normal breath sounds. No wheezing or rales.  Abdominal:     General: Bowel sounds are normal. There is no distension.     Palpations: Abdomen is soft. There is no mass.     Tenderness: There is no abdominal tenderness.  Musculoskeletal:        General: No tenderness. Normal range of motion.     Cervical back: Normal range of motion and neck supple.     Comments: Upper / Lower Extremities: - Normal muscle tone, strength bilateral upper extremities 5/5, lower extremities 5/5  Lymphadenopathy:     Cervical: No cervical adenopathy.  Skin:    General: Skin is warm and dry.     Findings: No erythema or rash.  Neurological:     Mental Status: He is alert and oriented to person, place, and time.     Comments: Distal sensation intact to light touch all extremities  Psychiatric:        Mood  and Affect: Mood normal.        Behavior: Behavior normal.        Thought Content: Thought content normal.     Comments: Well groomed, good eye contact, normal speech and thoughts     Results for orders placed or performed in visit on 05/19/24  Lipid panel   Collection Time: 05/22/24  8:07 AM  Result Value Ref Range   Cholesterol 144 <200 mg/dL   HDL 32 (L) > OR = 40 mg/dL   Triglycerides 803 (H) <150 mg/dL   LDL Cholesterol (Calc) 83 mg/dL (calc)   Total CHOL/HDL Ratio 4.5 <5.0 (calc)   Non-HDL Cholesterol (Calc) 112 <130 mg/dL (calc)  Hemoglobin J8r   Collection Time: 05/22/24  8:07 AM  Result Value Ref Range   Hgb A1c MFr Bld 4.7 <5.7 %   Mean Plasma Glucose 88 mg/dL   eAG (mmol/L) 4.9 mmol/L  CBC with Differential/Platelet   Collection Time: 05/22/24  8:07 AM  Result Value Ref Range   WBC 9.5 3.8 - 10.8 Thousand/uL   RBC 5.44 4.20 - 5.80 Million/uL   Hemoglobin 17.0 13.2 - 17.1 g/dL   HCT 48.3 (H) 61.4 - 49.9 %   MCV 94.9 80.0 - 100.0 fL   MCH 31.3 27.0 - 33.0 pg   MCHC 32.9 32.0 - 36.0 g/dL   RDW 87.5 88.9 - 84.9 %   Platelets 172 140 - 400 Thousand/uL   MPV 10.7 7.5 - 12.5 fL   Neutro Abs 7,553 1,500 -  7,800 cells/uL   Absolute Lymphocytes 1,235 850 - 3,900 cells/uL   Absolute Monocytes 475 200 - 950 cells/uL   Eosinophils Absolute 124 15 - 500 cells/uL   Basophils Absolute 114 0 - 200 cells/uL   Neutrophils Relative % 79.5 %   Total Lymphocyte 13.0 %   Monocytes Relative 5.0 %   Eosinophils Relative 1.3 %   Basophils Relative 1.2 %  PSA   Collection Time: 05/22/24  8:07 AM  Result Value Ref Range   PSA 0.32 < OR = 4.00 ng/mL  TSH   Collection Time: 05/22/24  8:07 AM  Result Value Ref Range   TSH 2.17 0.40 - 4.50 mIU/L  Comprehensive metabolic panel with GFR   Collection Time: 05/22/24  8:07 AM  Result Value Ref Range   Glucose, Bld 110 65 - 139 mg/dL   BUN 13 7 - 25 mg/dL   Creat 9.29 9.39 - 8.70 mg/dL   eGFR 884 > OR = 60 fO/fpw/8.26f7    BUN/Creatinine Ratio SEE NOTE: 6 - 22 (calc)   Sodium 142 135 - 146 mmol/L   Potassium 4.5 3.5 - 5.3 mmol/L   Chloride 106 98 - 110 mmol/L   CO2 26 20 - 32 mmol/L   Calcium 9.1 8.6 - 10.3 mg/dL   Total Protein 6.8 6.1 - 8.1 g/dL   Albumin 4.2 3.6 - 5.1 g/dL   Globulin 2.6 1.9 - 3.7 g/dL (calc)   AG Ratio 1.6 1.0 - 2.5 (calc)   Total Bilirubin 0.4 0.2 - 1.2 mg/dL   Alkaline phosphatase (APISO) 65 36 - 130 U/L   AST 12 10 - 40 U/L   ALT 7 (L) 9 - 46 U/L      Assessment & Plan:   Problem List Items Addressed This Visit     Centrilobular emphysema (HCC)   Relevant Medications   TRELEGY ELLIPTA 100-62.5-25 MCG/ACT AEPB   Elevated hemoglobin A1c   GAD (generalized anxiety disorder)   Other Visit Diagnoses       Annual physical exam    -  Primary     Hypertriglyceridemia       Relevant Medications   sildenafil  (REVATIO ) 20 MG tablet     Colon cancer screening       Relevant Orders   Cologuard     Erectile dysfunction, unspecified erectile dysfunction type       Relevant Medications   sildenafil  (REVATIO ) 20 MG tablet        Updated Health Maintenance information Reviewed recent lab results with patient Encouraged improvement to lifestyle with diet and exercise Goal of weight loss   Chronic obstructive pulmonary disease (COPD) Current inhaler Breztri  ineffective. Considering Trelegy for better management due to device issues and adherence. - Switch from Breztri  to Trelegy inhaler, one puff per day. - Send prescription to [facility] for a three-month supply.  Tobacco use disorder Continues smoking, contributing to elevated hematocrit, skin thinning, capillary fragility, and easy bruising. Stress is a barrier to quitting. - Discuss future plans to reduce smoking as a long-term health goal.  Easy bruising and skin thinning Likely due to age, smoking, and possible anti-inflammatory use. Smoking contributes to capillary fragility and skin thinning. - Discuss smoking  cessation as a method to reduce bruising and skin thinning.  Hypertriglyceridemia Triglycerides elevated at 196 mg/dL. LDL cholesterol well controlled at 83 mg/dL.  Erectile dysfunction Considering switch from Cialis  to Viagra  due to perceived side effects. Less than 2% chance of vision changes with Tadalafil . -  Switch from Cialis  to Viagra  (Sildenafil ). - Order 90 tablets of Viagra  using GoodRx for cost management.  Adult Wellness Visit Annual wellness visit conducted. Blood pressure 124/76 mmHg. Weight in 150s. Normal PSA, TSH, and A1c levels. No signs of prostate cancer or thyroid issues. A1c 4.7. Elevated triglycerides at 196 mg/dL. LDL cholesterol well controlled at 83 mg/dL. Hematocrit slightly elevated, likely due to smoking. Overall satisfactory blood work. - Order Cologuard for colon cancer screening. - Recommend multivitamin for age 85-50+. - Schedule six-month follow-up appointment.         Orders Placed This Encounter  Procedures   Cologuard    Meds ordered this encounter  Medications   TRELEGY ELLIPTA 100-62.5-25 MCG/ACT AEPB    Sig: Inhale 1 puff into the lungs daily.    Dispense:  3 each    Refill:  3    Switch from Breztri  to Trelegy, 3 month   sildenafil  (REVATIO ) 20 MG tablet    Sig: Take 1-5 pills about 30 min prior to sex    Dispense:  90 tablet    Refill:  3     Follow up plan: Return in about 6 months (around 11/27/2024) for 6 month follow-up COPD updates.  Marsa Officer, DO [facility] 05/29/2024, 1:48 PM

## 2024-05-29 NOTE — Patient Instructions (Addendum)
 Thank you for coming to the office today.  COPD Switch inhaler from Breztri  to Trelegy, 1 puff per day  Switch the Cialis  w Viagra  new order, for generic. Use goodrx.com coupon   Please schedule a Follow-up Appointment to: Return in about 6 months (around 11/27/2024) for 6 month follow-up COPD updates.  If you have any other questions or concerns, please feel free to call the office or send a message through MyChart. You may also schedule an earlier appointment if necessary.  Additionally, you may be receiving a survey about your experience at our office within a few days to 1 week by e-mail or mail. We value your feedback.  Marsa Officer, DO [facility], [place]

## 2024-07-07 ENCOUNTER — Other Ambulatory Visit: Payer: Self-pay | Admitting: Family Medicine

## 2024-07-07 ENCOUNTER — Telehealth: Admitting: Family Medicine

## 2024-07-07 DIAGNOSIS — J020 Streptococcal pharyngitis: Secondary | ICD-10-CM

## 2024-07-07 DIAGNOSIS — J432 Centrilobular emphysema: Secondary | ICD-10-CM

## 2024-07-07 MED ORDER — AMOXICILLIN 500 MG PO CAPS: 500.0000 mg | ORAL_CAPSULE | Freq: Two times a day (BID) | ORAL | 0 refills | Status: AC

## 2024-07-07 NOTE — Progress Notes (Signed)

## 2024-07-10 NOTE — Telephone Encounter (Signed)
 Requested Prescriptions  Pending Prescriptions Disp Refills   VENTOLIN  HFA 108 (90 Base) MCG/ACT inhaler [Pharmacy Med Name: VENTOLIN  HFA 90 MCG INHALER] 18 each 0    Sig: INHALE 1 OR 2 PUFFS EVERY 6 HOURS AS NEEDED FOR WHEEZE OR FOR SHORTNESS OF BREATH     Pulmonology:  Beta Agonists 2 Passed - 07/10/2024 11:38 AM      Passed - Last BP in normal range    BP Readings from Last 1 Encounters:  05/29/24 124/76         Passed - Last Heart Rate in normal range    Pulse Readings from Last 1 Encounters:  05/29/24 72         Passed - Valid encounter within last 12 months    Recent Outpatient Visits           1 month ago Annual physical exam   [facility] [place], Marsa PARAS, [place]

## 2024-09-18 ENCOUNTER — Telehealth: Admitting: Family Medicine

## 2024-09-18 DIAGNOSIS — J029 Acute pharyngitis, unspecified: Secondary | ICD-10-CM

## 2024-09-18 NOTE — Progress Notes (Signed)
 SABRA

## 2024-09-18 NOTE — Progress Notes (Signed)
   I feel your condition warrants further evaluation and I recommend that you be seen in a face-to-face visit.   NOTE: There will be NO CHARGE for this E-Visit   If you are having a true medical emergency, please call 911.     For an urgent face to face visit, [place] has multiple urgent care centers for your convenience.  Click the link below for the full list of locations and hours, walk-in wait times, appointment scheduling options and driving directions:  Urgent Care - [place], [place], [place], [place], [place], [place]  [place]     Your MyChart E-visit questionnaire answers were reviewed by a board certified advanced clinical practitioner to complete your personal care plan based on your specific symptoms.    Thank you for using e-Visits.

## 2024-11-27 ENCOUNTER — Ambulatory Visit: Admitting: Family Medicine
